# Patient Record
Sex: Male | Born: 1960 | Race: White | Hispanic: No | Marital: Married | Smoking: Former smoker
Health system: Southern US, Community
[De-identification: ages and names within clinical notes are randomized; demographics above are authoritative.]

## PROBLEM LIST (undated history)

## (undated) DIAGNOSIS — F419 Anxiety disorder, unspecified: Secondary | ICD-10-CM

## (undated) DIAGNOSIS — I10 Essential (primary) hypertension: Secondary | ICD-10-CM

## (undated) DIAGNOSIS — K639 Disease of intestine, unspecified: Secondary | ICD-10-CM

## (undated) DIAGNOSIS — D649 Anemia, unspecified: Secondary | ICD-10-CM

## (undated) DIAGNOSIS — Z5189 Encounter for other specified aftercare: Secondary | ICD-10-CM

## (undated) DIAGNOSIS — Z872 Personal history of diseases of the skin and subcutaneous tissue: Secondary | ICD-10-CM

## (undated) HISTORY — DX: Encounter for other specified aftercare: Z51.89

## (undated) HISTORY — DX: Essential (primary) hypertension: I10

## (undated) HISTORY — DX: Anemia, unspecified: D64.9

## (undated) HISTORY — DX: Personal history of diseases of the skin and subcutaneous tissue: Z87.2

## (undated) HISTORY — DX: Anxiety disorder, unspecified: F41.9

## (undated) HISTORY — DX: Disease of intestine, unspecified: K63.9

## (undated) HISTORY — PX: COLONOSCOPY: SHX174

---

## 1998-11-16 ENCOUNTER — Emergency Department (HOSPITAL_COMMUNITY): Admission: EM | Admit: 1998-11-16 | Discharge: 1998-11-16 | Payer: Self-pay | Admitting: Emergency Medicine

## 2002-03-05 ENCOUNTER — Encounter: Payer: Self-pay | Admitting: Emergency Medicine

## 2002-03-05 ENCOUNTER — Emergency Department (HOSPITAL_COMMUNITY): Admission: EM | Admit: 2002-03-05 | Discharge: 2002-03-05 | Payer: Self-pay | Admitting: Emergency Medicine

## 2006-10-05 ENCOUNTER — Ambulatory Visit (HOSPITAL_BASED_OUTPATIENT_CLINIC_OR_DEPARTMENT_OTHER): Admission: RE | Admit: 2006-10-05 | Discharge: 2006-10-05 | Payer: Self-pay | Admitting: Orthopedic Surgery

## 2010-06-23 ENCOUNTER — Emergency Department (HOSPITAL_BASED_OUTPATIENT_CLINIC_OR_DEPARTMENT_OTHER)
Admission: EM | Admit: 2010-06-23 | Discharge: 2010-06-23 | Payer: Self-pay | Source: Home / Self Care | Admitting: Emergency Medicine

## 2010-11-19 NOTE — Op Note (Signed)
NAME:  Todd Osborn, Todd Osborn                 ACCOUNT NO.:  [id number]   MEDICAL RECORD NO.:  [id number]          PATIENT TYPE:  AMB   LOCATION:  DSC                          FACILITY:  [facility]   PHYSICIAN:  Katy Fitch. Sypher, M.D. 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   POSTOPERATIVE DIAGNOSES:  1. Painful mass, dorsal aspect of left wrist, distal radioulnar joint,      with impairment of supination and pronation due to pain with MRI      evidence of a probable triangular fibrocartilage complex tear with      synovitis and myxoma of distal radioulnar joint with remodeling of      distal ulnar dorsal articular surface.  2. 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   OPERATION:  1. Diagnostic arthroscopy, right wrist, with confirmation of synovitis      and irregular reactive tissues on the dorsal aspect of the left      ulnocarpal joint.  2. Open exploration of ulnocarpal joint with open synovectomy and      removal of myxomatous lesion and degenerative triangular      fibrocartilage adjacent to the dorsal radioulnar ligament.  3. Open resection of an intracapsular ganglion of the left distal      radioulnar joint.   OPERATING SURGEON:  Katy Fitch. Sypher, M.D.   ASSISTANT:  Jonni Sanger, P.A.-C   ANESTHESIA:  General by LMA.   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  Careful clinical examination of his hands and  wrists revealed that he had pain with pronation and supination of the  left wrist and a vague mass over the dorsal aspect of the distal ulna.  He had signs of a possible ulnocarpal internal derangement.   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  He was sent for an MRI  to examine if he had a mass developing in his distal radioulnar joint  and to examine the integrity of his triangular fibrocartilage.  The MRI  returned with a probable central degenerative tear of the triangular  fibrocartilage and a myxomatous type lesion in the dorsal capsule of the  distal radioulnar joint, causing erosion of the dorsal aspect of the  ulnar head.   We advised Todd Osborn to proceed with diagnostic arthroscopy of the  wrist to confirm the triangular fibrocartilage pathology  and to address  any synovitis within the wrist and recommended open excision of his  myxomatous lesion.  After informed consent, he is brought to the  operating room at this time.   PROCEDURE:  Todd Osborn is brought to the operating room and placed in a  supine position on the operating table.   Following an anesthesia consult with Dr. Sampson Goon, general anesthesia  by LMA was recommended and accepted.   Todd Osborn was transferred to room 8, placed in the supine position on  the operating table followed by induction of general anesthesia by LMA  technique under Dr. Jarrett Ables direct supervision.   The procedure commenced with routine Betadine scrub and paint of the  left upper extremity and placement of a pneumatic tourniquet on the  proximal left brachium.  Vancomycin 1 gram was administered as an IV  prophylactic antibiotic due to a penicillin allergy.   The left arm was then exsanguinated with an Esmarch bandage and the   arterial tourniquet inflated to 240 mmHg.  The procedure commenced with  distraction of Todd Osborn's wrist in the tower designed for wrist  arthroscopy.   The wrist was instrumented with an 18 gauge needle through the 3-4  dorsal portal followed by distention of the wrist with sterile saline.  The scope was placed with atraumatic technique.  Diagnostic arthroscopy  revealed intact hyaline articular cartilage on the scaphoid, lunate and  triquetrum.   There was minor degenerative change of the scapholunate interosseous  ligament.  The scaphoid and lunate facets of the radius were normal.  Adjacent to the triangular fibrocartilage on the dorsum of the  ulnocarpal joint there was a considerable amount of reactive tissue and  synovial hypertrophy rendering it very challenging to visualize the  entire triangular fibrocartilage.   Due to a very steep and oblique nature of Todd Osborn's lunate facet, I  did not struggle to place the arthroscope into the ulnocarpal joint.  Also, given the fact that the myxoma was directly in the path of the  portal adjacent to the 5-6 or 6R portal, I elected to proceed with open  technique so as not to disrupt the myxoma.   The arthroscopic equipment was removed followed by creation of an L-  shaped incision transversely across the dorsal aspect of the ulnar  carpal articulation and extending proximally paralleling the 5th dorsal  compartment.  Subcutaneous tissue was carefully divided, taking care to  identify and gently retract the dorsal ulnar cutaneous sensory branches.  The extensor retinaculum was released over the 5th dorsal compartment  and the extensor digiti minimi retracted.  The distal radioulnar joint  capsule was bulging.  This was carefully opened with a 15 scalpel blade,  revealing a very large, gray-purple myxoma.  This was circumferentially  dissected and had about 5 mL of myxomatous material within the capsule. This did not enter the  joint.   This was meticulously debrided with a rongeur followed by use of a  microcurette and bipolar cautery.  All of the abnormal tissue was  removed as well as a pseudomembrane lining the cyst.   The distal radioulnar joint was inspected and found to have moderate  synovitis.  This was removed with a microrongeur followed by hemostasis  with bipolar cautery.   The deep surface of the triangular fibrocartilage was inspected and  palpated with the aid of a Therapist, nutritional.  There was no sign of a large  full-thickness perforation.  A transverse arthrotomy was fashioned  between the ulnar head and the triquetrum.  Great care was taken to  gently spread and avoid injury to the dorsal intercarpal ligament.   The hypertrophic synovitis and capsular tissues adjacent to the  triangular fibrocartilage were removed with open technique utilizing the  suction shaver.  The triangular fibrocartilage distal surface was  inspected and found to be intact, except for a microscopic perforation.   After completion of the open synovectomy, the wrist was irrigated with  the arthroscopic cannula followed by repair of the capsule with mattress  sutures of 3-0 Ethibond.   The capsule of the distal radioulnar joint was debrided of redundant  tissue and subsequently repaired with a series of figure of eight  sutures of 3-0 Ethibond followed by a running suture of 3-0 Ethibond for  a smooth repair at the floor of the 5th dorsal compartment.   The distal portion of the 5th dorsal compartment was reconstructed.  However, the entire compartment was not reconstructed due to the bulk of  the suture line at the floor of the compartment.   The distal radioulnar joint was noted to be completely stable in  pronation and supination.   The skin was then repaired with subdermal sutures of 4-0 Vicryl and  intradermal 3-0 Prolene.  Mr. Robideau will be immobilized in a sugar-tong  splint maintaining his forearm in  comfortable supination.   There were no apparent complications.   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      Katy Fitch Sypher, M.D.  Electronically Signed     RVS/MEDQ  D:  10/05/2006  T:  10/05/2006  Job:  [id number]

## 2016-10-10 ENCOUNTER — Encounter (HOSPITAL_COMMUNITY): Payer: Self-pay | Admitting: Emergency Medicine

## 2016-10-10 ENCOUNTER — Emergency Department (HOSPITAL_COMMUNITY)
Admission: EM | Admit: 2016-10-10 | Discharge: 2016-10-10 | Disposition: A | Discharge status: Home / Self Care | Payer: No Typology Code available for payment source | Attending: Emergency Medicine | Admitting: Emergency Medicine

## 2016-10-10 ENCOUNTER — Emergency Department (HOSPITAL_BASED_OUTPATIENT_CLINIC_OR_DEPARTMENT_OTHER)
Admit: 2016-10-10 | Discharge: 2016-10-10 | Disposition: A | Discharge status: Home / Self Care | Payer: No Typology Code available for payment source

## 2016-10-10 DIAGNOSIS — L03116 Cellulitis of left lower limb: Secondary | ICD-10-CM

## 2016-10-10 DIAGNOSIS — M7989 Other specified soft tissue disorders: Secondary | ICD-10-CM

## 2016-10-10 DIAGNOSIS — Z5321 Procedure and treatment not carried out due to patient leaving prior to being seen by health care provider: Secondary | ICD-10-CM | POA: Insufficient documentation

## 2016-10-10 DIAGNOSIS — Z79899 Other long term (current) drug therapy: Secondary | ICD-10-CM | POA: Diagnosis not present

## 2016-10-10 DIAGNOSIS — M79609 Pain in unspecified limb: Secondary | ICD-10-CM

## 2016-10-10 DIAGNOSIS — Z7722 Contact with and (suspected) exposure to environmental tobacco smoke (acute) (chronic): Secondary | ICD-10-CM | POA: Diagnosis not present

## 2016-10-10 LAB — COMPREHENSIVE METABOLIC PANEL
ALBUMIN: 3.8 g/dL (ref 3.5–5.0)
ALK PHOS: 51 U/L (ref 38–126)
ALT: 38 U/L (ref 17–63)
AST: 29 U/L (ref 15–41)
Anion gap: 7 (ref 5–15)
BILIRUBIN TOTAL: 0.7 mg/dL (ref 0.3–1.2)
BUN: 15 mg/dL (ref 6–20)
CALCIUM: 8.8 mg/dL — AB (ref 8.9–10.3)
CO2: 26 mmol/L (ref 22–32)
Chloride: 103 mmol/L (ref 101–111)
Creatinine, Ser: 0.99 mg/dL (ref 0.61–1.24)
GFR calc Af Amer: >60 mL/min (ref >60)
GFR calc non Af Amer: >60 mL/min (ref >60)
GLUCOSE: 129 mg/dL — AB (ref 65–99)
Potassium: 3.5 mmol/L (ref 3.5–5.1)
SODIUM: 136 mmol/L (ref 135–145)
TOTAL PROTEIN: 8.1 g/dL (ref 6.5–8.1)

## 2016-10-10 LAB — CBC WITH DIFFERENTIAL/PLATELET
BASOS ABS: 0 10*3/uL (ref 0.0–0.1)
BASOS PCT: 0 %
EOS PCT: 1 %
Eosinophils Absolute: 0.1 10*3/uL (ref 0.0–0.7)
HEMATOCRIT: 40.2 % (ref 39.0–52.0)
Hemoglobin: 13.6 g/dL (ref 13.0–17.0)
LYMPHS PCT: 8 %
Lymphs Abs: 1.1 10*3/uL (ref 0.7–4.0)
MCH: 29.4 pg (ref 26.0–34.0)
MCHC: 33.8 g/dL (ref 30.0–36.0)
MCV: 87 fL (ref 78.0–100.0)
MONO ABS: 1.2 10*3/uL — AB (ref 0.1–1.0)
MONOS PCT: 9 %
Neutro Abs: 11.2 10*3/uL — ABNORMAL HIGH (ref 1.7–7.7)
Neutrophils Relative %: 82 %
PLATELETS: 220 10*3/uL (ref 150–400)
RBC: 4.62 MIL/uL (ref 4.22–5.81)
RDW: 13.1 % (ref 11.5–15.5)
WBC: 13.5 10*3/uL — ABNORMAL HIGH (ref 4.0–10.5)

## 2016-10-10 LAB — URINALYSIS, ROUTINE W REFLEX MICROSCOPIC
BILIRUBIN URINE: NEGATIVE
Glucose, UA: NEGATIVE mg/dL
Ketones, ur: NEGATIVE mg/dL
LEUKOCYTES UA: NEGATIVE
Nitrite: NEGATIVE
PH: 5 (ref 5.0–8.0)
Protein, ur: 30 mg/dL — AB
SPECIFIC GRAVITY, URINE: 1.028 (ref 1.005–1.030)

## 2016-10-10 LAB — I-STAT CG4 LACTIC ACID, ED
LACTIC ACID, VENOUS: 1.06 mmol/L (ref 0.5–1.9)
Lactic Acid, Venous: 1.11 mmol/L (ref 0.5–1.9)

## 2016-10-10 MED ORDER — CLINDAMYCIN HCL 300 MG PO CAPS: 300.0000 mg | ORAL_CAPSULE | Freq: Four times a day (QID) | ORAL | 0 refills | Status: AC

## 2016-10-10 MED ORDER — OXYCODONE-ACETAMINOPHEN 5-325 MG PO TABS: 1.0000 | ORAL_TABLET | ORAL | 0 refills | Status: DC | PRN

## 2016-10-10 MED ORDER — OXYCODONE-ACETAMINOPHEN 5-325 MG PO TABS
1.0000 | ORAL_TABLET | Freq: Once | ORAL | Status: AC
  Administered 2016-10-10: 1 via ORAL
  Filled 2016-10-10: qty 1

## 2016-10-10 MED ORDER — MORPHINE SULFATE (PF) 2 MG/ML IV SOLN: 4.0000 mg | Freq: Once | INTRAVENOUS | Status: DC

## 2016-10-10 MED ORDER — CLINDAMYCIN PHOSPHATE 600 MG/50ML IV SOLN
600.0000 mg | Freq: Once | INTRAVENOUS | Status: AC
  Administered 2016-10-10: 600 mg via INTRAVENOUS
  Filled 2016-10-10: qty 50

## 2016-10-10 NOTE — ED Notes (Addendum)
Provided patient a ham sandwich and water with permission from provider.

## 2016-10-10 NOTE — ED Triage Notes (Signed)
Pt was just seen and discharged from here at about 1830 for c/o cellulitis of his left leg  Pt state she was given IV antibiotics and given prescriptions  Pt states he was told if his fever was over 100.4 at home to return  Pt states his fever was 101.2 at home so he came back  Pt also states his pharmacy was not open so he could not get his prescriptions

## 2016-10-10 NOTE — ED Triage Notes (Signed)
Pt c/o feeling sick yesterday with episode of emesis.  Pt reports having leg pain onset of 11 today, raised his pant leg and lower left leg is red and slightly swollen.

## 2016-10-10 NOTE — ED Notes (Signed)
1ST SET OF CULTURES DOWN IN MAIN LAB

## 2016-10-10 NOTE — ED Notes (Signed)
Patient was able to tolerate water with no nausea or vomiting. Requesting sandwich.

## 2016-10-10 NOTE — Discharge Instructions (Signed)
Read the information below.  Use the prescribed medication as directed.  Please discuss all new medications with your pharmacist.  Do not take additional tylenol while taking the prescribed pain medication to avoid overdose.  You may return to the Emergency Department at any time for worsening condition or any new symptoms that concern you.     If you develop increased redness, swelling, pus draining from the wound, or fevers greater than 100.4, return to the ER immediately for a recheck.

## 2016-10-10 NOTE — ED Provider Notes (Signed)
[facility] DEPT Provider Note   CSN: [id number] Arrival date & time: 10/10/16  1452     History   Chief Complaint Chief Complaint  Patient presents with  . Cellulitis    HPI Todd Osborn is a 56 y.o. male.  HPI   Pt presents with pain, redness, head over left lower extremity that he first noticed this morning.  He reports waking up yesterday with headache, chills, nausea.  Vomited several times yesterday.  This morning he noticed redness and aching pain in the front of his left shin.  Denies any injury to the leg.   Pt does not have a PCP.  Has not seen a medical provider in 4 years.    Was visiting his wife at [facility] two days ago.    History reviewed. No pertinent past medical history.  There are no active problems to display for this patient.   History reviewed. No pertinent surgical history.     Home Medications    Prior to Admission medications   Medication Sig Start Date End Date Taking? Authorizing Provider  ibuprofen (ADVIL,MOTRIN) 200 MG tablet Take 400-600 mg by mouth every 6 (six) hours as needed (pain).   Yes Historical Provider, MD  clindamycin (CLEOCIN) 300 MG capsule Take 1 capsule (300 mg total) by mouth 4 (four) times daily. 10/10/16 10/20/16  Clayton Bibles, PA-C  oxyCODONE-acetaminophen (PERCOCET/ROXICET) 5-325 MG tablet Take 1 tablet by mouth every 4 (four) hours as needed for moderate pain or severe pain. 10/10/16   Clayton Bibles, PA-C    Family History No family history on file.  Social History Social History  Substance Use Topics  . Smoking status: Passive Smoke Exposure - Never Smoker  . Smokeless tobacco: Not on file  . Alcohol use Yes     Allergies   Penicillins   Review of Systems Review of Systems  All other systems reviewed and are negative.    Physical Exam Updated Vital Signs BP 136/78 (BP Location: Right Arm)   Pulse 80   Temp 99.3 F (37.4 C) (Oral)   Resp 20   Ht 6' (1.829 m)   Wt 132 kg   SpO2 96%   BMI  39.47 kg/m   Physical Exam  Constitutional: He appears well-developed and well-nourished. No distress.  Obese   HENT:  Head: Normocephalic and atraumatic.  Neck: Neck supple.  Cardiovascular: Normal rate and regular rhythm.   Pulmonary/Chest: Effort normal and breath sounds normal. No respiratory distress. He has no wheezes. He has no rales.  Abdominal: Soft. He exhibits no distension and no mass. There is no tenderness. There is no rebound and no guarding.  Neurological: He is alert. He exhibits normal muscle tone.  Skin: He is not diaphoretic.  Left lower leg with erythema, edema, warmth, tenderness over shin (see photo).   Nursing note and vitals reviewed.      ED Treatments / Results  Labs (all labs ordered are listed, but only abnormal results are displayed) Labs Reviewed  COMPREHENSIVE METABOLIC PANEL - Abnormal; Notable for the following:       Result Value   Glucose, Bld 129 (*)    Calcium 8.8 (*)    All other components within normal limits  CBC WITH DIFFERENTIAL/PLATELET - Abnormal; Notable for the following:    WBC 13.5 (*)    Neutro Abs 11.2 (*)    Monocytes Absolute 1.2 (*)    All other components within normal limits  URINALYSIS, ROUTINE W REFLEX MICROSCOPIC -  Abnormal; Notable for the following:    APPearance HAZY (*)    Hgb urine dipstick SMALL (*)    Protein, ur 30 (*)    Bacteria, UA RARE (*)    Squamous Epithelial / LPF 0-5 (*)    All other components within normal limits  I-STAT CG4 LACTIC ACID, ED  I-STAT CG4 LACTIC ACID, ED    EKG  EKG Interpretation None       Radiology No results found.  Procedures Procedures (including critical care time)  Medications Ordered in ED Medications  clindamycin (CLEOCIN) IVPB 600 mg (0 mg Intravenous Stopped 10/10/16 1735)  oxyCODONE-acetaminophen (PERCOCET/ROXICET) 5-325 MG per tablet 1 tablet (1 tablet Oral Given 10/10/16 1639)     Initial Impression / Assessment and Plan / ED Course  I have  reviewed the triage vital signs and the nursing notes.  Pertinent labs & imaging results that were available during my care of the patient were reviewed by me and considered in my medical decision making (see chart for details).  Clinical Course as of Oct 10 2224  Mon Oct 10, 2016  1626 Edge of cellulitis marked with skin marker   [EW]    Clinical Course User Index [EW] Clayton Bibles, PA-C    Afebrile, nontoxic patient with cellulitis of left lower extremity.  Negative for clot.  Labs significant for leukocytosis, mild hyperglycemia.  Cellulitis marked.  IV clindamycin given in ED.  Pt doing well with pain control and tolerating PO easily.  D/C home with pain medication, clindamycin 300mg  QID with instructed recheck within 48 hours, sooner if worsening symptoms.  Discussed strict return precautions.  Discussed result, findings, treatment, and follow up  with patient.  Pt given return precautions.  Pt verbalizes understanding and agrees with plan.       Final Clinical Impressions(s) / ED Diagnoses   Final diagnoses:  Cellulitis of left lower extremity    New Prescriptions Discharge Medication List as of 10/10/2016  6:56 PM    START taking these medications   Details  clindamycin (CLEOCIN) 300 MG capsule Take 1 capsule (300 mg total) by mouth 4 (four) times daily., Starting Mon 10/10/2016, Until Thu 10/20/2016, Print    oxyCODONE-acetaminophen (PERCOCET/ROXICET) 5-325 MG tablet Take 1 tablet by mouth every 4 (four) hours as needed for moderate pain or severe pain., Starting Mon 10/10/2016, Print         [place], PA-C 10/10/16 2227    Duffy Bruce, MD 10/12/16 [phone]

## 2016-10-10 NOTE — ED Notes (Signed)
Provided water for PO challenge

## 2016-10-10 NOTE — Progress Notes (Addendum)
*  PRELIMINARY RESULTS* Vascular Ultrasound Left lower extremity venous duplex has been completed.  Preliminary findings: No evidence of deep vein thrombosis in the visualized veins of the left lower extremity.  Negative for baker's cyst.  Preliminary results given to [facility] @ 18:45  Everrett Coombe 10/10/2016, 6:20 PM

## 2016-10-11 ENCOUNTER — Emergency Department (HOSPITAL_COMMUNITY)
Admission: EM | Admit: 2016-10-11 | Discharge: 2016-10-11 | Disposition: A | Discharge status: Home / Self Care | Payer: No Typology Code available for payment source | Source: Home / Self Care

## 2016-10-11 NOTE — ED Notes (Signed)
Called for second time  No response from lobby 

## 2016-10-11 NOTE — ED Notes (Signed)
Called for third time  No response

## 2016-10-11 NOTE — ED Notes (Signed)
Called to take to room  No answer from lobby 

## 2016-10-13 ENCOUNTER — Emergency Department (HOSPITAL_COMMUNITY)
Admission: EM | Admit: 2016-10-13 | Discharge: 2016-10-13 | Disposition: A | Discharge status: Home / Self Care | Payer: No Typology Code available for payment source | Attending: Emergency Medicine | Admitting: Emergency Medicine

## 2016-10-13 ENCOUNTER — Encounter (HOSPITAL_COMMUNITY): Payer: Self-pay | Admitting: Emergency Medicine

## 2016-10-13 DIAGNOSIS — Z7722 Contact with and (suspected) exposure to environmental tobacco smoke (acute) (chronic): Secondary | ICD-10-CM | POA: Diagnosis not present

## 2016-10-13 DIAGNOSIS — L03116 Cellulitis of left lower limb: Secondary | ICD-10-CM | POA: Diagnosis present

## 2016-10-13 DIAGNOSIS — Z79899 Other long term (current) drug therapy: Secondary | ICD-10-CM | POA: Diagnosis not present

## 2016-10-13 NOTE — ED Provider Notes (Signed)
[facility] DEPT Provider Note   CSN: [id number] Arrival date & time: 10/13/16  0730     History   Chief Complaint Chief Complaint  Patient presents with  . Cellulitis    HPI Todd Osborn is a 56 y.o. male.  HPI : 56 year old male. He presents for reevaluation of cellulitis. Seen and evaluated on Monday, 3 days ago. Was given IV Protonix has been on clindamycin 4 times a day. He states it feels better. His head or shakes or chills or rigors. Continued redness of the leg. The area of demarcation is still in place from the erythema has receded well within it. Tolerating clindamycin. No diarrhea or abdominal discomfort.  History reviewed. No pertinent past medical history.  There are no active problems to display for this patient.   History reviewed. No pertinent surgical history.     Home Medications    Prior to Admission medications   Medication Sig Start Date End Date Taking? Authorizing Provider  clindamycin (CLEOCIN) 300 MG capsule Take 1 capsule (300 mg total) by mouth 4 (four) times daily. 10/10/16 10/20/16  Clayton Bibles, PA-C  ibuprofen (ADVIL,MOTRIN) 200 MG tablet Take 400-600 mg by mouth every 6 (six) hours as needed (pain).    Historical Provider, MD  oxyCODONE-acetaminophen (PERCOCET/ROXICET) 5-325 MG tablet Take 1 tablet by mouth every 4 (four) hours as needed for moderate pain or severe pain. 10/10/16   Clayton Bibles, PA-C    Family History No family history on file.  Social History Social History  Substance Use Topics  . Smoking status: Passive Smoke Exposure - Never Smoker  . Smokeless tobacco: Never Used  . Alcohol use Yes     Allergies   Penicillins   Review of Systems Review of Systems  Constitutional: Negative for appetite change, chills, diaphoresis, fatigue and fever.  HENT: Negative for mouth sores, sore throat and trouble swallowing.   Eyes: Negative for visual disturbance.  Respiratory: Negative for cough, chest tightness, shortness of  breath and wheezing.   Cardiovascular: Negative for chest pain.  Gastrointestinal: Negative for abdominal distention, abdominal pain, diarrhea, nausea and vomiting.  Endocrine: Negative for polydipsia, polyphagia and polyuria.  Genitourinary: Negative for dysuria, frequency and hematuria.  Musculoskeletal: Negative for gait problem.  Skin: Positive for rash. Negative for color change and pallor.  Neurological: Negative for dizziness, syncope, light-headedness and headaches.  Hematological: Does not bruise/bleed easily.  Psychiatric/Behavioral: Negative for behavioral problems and confusion.     Physical Exam Updated Vital Signs BP 136/76 (BP Location: Right Arm)   Pulse 67   Temp 98.2 F (36.8 C) (Oral)   Resp 18   SpO2 95%   Physical Exam  Constitutional: He is oriented to person, place, and time. He appears well-developed and well-nourished. No distress.  HENT:  Head: Normocephalic.  Eyes: Conjunctivae are normal. Pupils are equal, round, and reactive to light. No scleral icterus.  Neck: Normal range of motion. Neck supple. No thyromegaly present.  Cardiovascular: Normal rate and regular rhythm.  Exam reveals no gallop and no friction rub.   No murmur heard. Pulmonary/Chest: Effort normal and breath sounds normal. No respiratory distress. He has no wheezes. He has no rales.  Abdominal: Soft. Bowel sounds are normal. He exhibits no distension. There is no tenderness. There is no rebound.  Musculoskeletal: Normal range of motion.  Neurological: He is alert and oriented to person, place, and time.  Skin: Skin is warm and dry. No rash noted.  Erythema consistent with cellulitis left lower leg.  It is receded within the area of demarcation. Not elevated. Slightly indurated. Good distal perfusion and capillary refill.  Psychiatric: He has a normal mood and affect. His behavior is normal.     ED Treatments / Results  Labs (all labs ordered are listed, but only abnormal results are  displayed) Labs Reviewed - No data to display  EKG  EKG Interpretation None       Radiology No results found.  Procedures Procedures (including critical care time)  Medications Ordered in ED Medications - No data to display   Initial Impression / Assessment and Plan / ED Course  I have reviewed the triage vital signs and the nursing notes.  Pertinent labs & imaging results that were available during my care of the patient were reviewed by me and considered in my medical decision making (see chart for details).     Improving in complaint of cellulitis. Continue current clinical course clindamycin and elevation.  Final Clinical Impressions(s) / ED Diagnoses   Final diagnoses:  Cellulitis of left lower extremity    New Prescriptions New Prescriptions   No medications on file     Tanna Furry, MD 10/13/16 [phone]

## 2016-10-13 NOTE — ED Notes (Signed)
ED Provider at bedside. 

## 2016-10-13 NOTE — ED Notes (Signed)
Pt verbalized understanding of discharge instructions and denies any further questions at this time.   

## 2016-10-13 NOTE — Discharge Instructions (Signed)
Continue Clidamycin four times per day. Elevate leg above your heart as much as possible. Return to ER with any worsening.

## 2016-10-13 NOTE — ED Triage Notes (Signed)
Pt here to follow up for left lower leg cellulitis, seen in ED on 10/11/16 for same, given IV antibiotics and prescriptions. Erythema and induration have receded from marked outline of previous borders. No fevers/chills/nausea/emesis currently.

## 2016-10-20 ENCOUNTER — Emergency Department (HOSPITAL_COMMUNITY)
Admission: EM | Admit: 2016-10-20 | Discharge: 2016-10-20 | Disposition: A | Discharge status: Home / Self Care | Payer: No Typology Code available for payment source | Attending: Emergency Medicine | Admitting: Emergency Medicine

## 2016-10-20 ENCOUNTER — Encounter (HOSPITAL_COMMUNITY): Payer: Self-pay | Admitting: Emergency Medicine

## 2016-10-20 DIAGNOSIS — Z79899 Other long term (current) drug therapy: Secondary | ICD-10-CM | POA: Diagnosis not present

## 2016-10-20 DIAGNOSIS — Z7722 Contact with and (suspected) exposure to environmental tobacco smoke (acute) (chronic): Secondary | ICD-10-CM | POA: Insufficient documentation

## 2016-10-20 DIAGNOSIS — R21 Rash and other nonspecific skin eruption: Secondary | ICD-10-CM | POA: Diagnosis present

## 2016-10-20 MED ORDER — PREDNISONE 20 MG PO TABS
60.0000 mg | ORAL_TABLET | Freq: Once | ORAL | Status: AC
  Administered 2016-10-20: 60 mg via ORAL
  Filled 2016-10-20: qty 3

## 2016-10-20 MED ORDER — PREDNISONE 10 MG PO TABS: 40.0000 mg | ORAL_TABLET | Freq: Every day | ORAL | 0 refills | Status: AC

## 2016-10-20 MED ORDER — DIPHENHYDRAMINE HCL 25 MG PO CAPS
50.0000 mg | ORAL_CAPSULE | Freq: Once | ORAL | Status: AC
  Administered 2016-10-20: 50 mg via ORAL
  Filled 2016-10-20: qty 2

## 2016-10-20 MED ORDER — CEPHALEXIN 500 MG PO CAPS: 500.0000 mg | ORAL_CAPSULE | Freq: Four times a day (QID) | ORAL | 0 refills | Status: AC

## 2016-10-20 NOTE — Discharge Instructions (Signed)
Please discontinue clindamycin. Start taking prednisone tomorrow 40 mg per day for the next 4 days. May use Benadryl over-the-counter as needed as well. Wait 24 hours to begin taking your new antibiotics until the rash starts to subside.  Follow-up with your primary care provider. Return to the emergency department if you experience difficulty breathing, swelling in throat, or any other new concerning symptoms.

## 2016-10-20 NOTE — ED Provider Notes (Signed)
[facility] DEPT Provider Note   CSN: [id number] Arrival date & time: 10/20/16  1962     History   Chief Complaint Chief Complaint  Patient presents with  . Rash    HPI [facility] is a 56 y.o. male presenting with a maculopapular pruritic rash that began on his abdomen and spread to his extremities over the last 2 days. He reports being seen for left lower extremity cellulitis on April 10 and started on clindamycin. He states that he has an allergy to penicillin which manifested the same way. He is otherwise healthy, cellulitis has been improving, no fever, chills, nausea, vomiting or any cold symptoms. He is up-to-date on immunizations. Denies any history of tick bites or exposure. Denies any new detergents, lotions, or exposure to new environments. He has not tried anything for pruritus or allergic reaction over-the-counter.  HPI  History reviewed. No pertinent past medical history.  There are no active problems to display for this patient.   History reviewed. No pertinent surgical history.     Home Medications    Prior to Admission medications   Medication Sig Start Date End Date Taking? Authorizing Provider  cephALEXin (KEFLEX) 500 MG capsule Take 1 capsule (500 mg total) by mouth 4 (four) times daily. 10/20/16 10/27/16  Emeline General, PA-C  clindamycin (CLEOCIN) 300 MG capsule Take 1 capsule (300 mg total) by mouth 4 (four) times daily. 10/10/16 10/20/16  Clayton Bibles, PA-C  ibuprofen (ADVIL,MOTRIN) 200 MG tablet Take 400-600 mg by mouth every 6 (six) hours as needed (pain).    Historical Provider, MD  oxyCODONE-acetaminophen (PERCOCET/ROXICET) 5-325 MG tablet Take 1 tablet by mouth every 4 (four) hours as needed for moderate pain or severe pain. 10/10/16   Clayton Bibles, PA-C  predniSONE (DELTASONE) 10 MG tablet Take 4 tablets (40 mg total) by mouth daily. 10/20/16 10/24/16  Emeline General, PA-C    Family History History reviewed. No pertinent family  history.  Social History Social History  Substance Use Topics  . Smoking status: Passive Smoke Exposure - Never Smoker  . Smokeless tobacco: Never Used  . Alcohol use Yes     Allergies   Penicillins   Review of Systems Review of Systems  Constitutional: Negative for chills and fever.  HENT: Negative for congestion, drooling, ear pain, facial swelling, sore throat and trouble swallowing.   Eyes: Negative for pain, discharge, redness, itching and visual disturbance.  Respiratory: Negative for cough, choking, chest tightness, shortness of breath, wheezing and stridor.   Cardiovascular: Positive for leg swelling. Negative for chest pain and palpitations.       Patient has left lower extremity swelling from recent cellulitis.  Gastrointestinal: Negative for abdominal pain, diarrhea, nausea and vomiting.  Genitourinary: Negative for dysuria and hematuria.  Musculoskeletal: Negative for arthralgias, back pain, joint swelling, myalgias and neck pain.  Skin: Positive for color change and rash. Negative for pallor and wound.  Neurological: Negative for dizziness, seizures, syncope, weakness, numbness and headaches.     Physical Exam Updated Vital Signs BP (!) 148/76 (BP Location: Right Arm)   Pulse 70   Temp 98.4 F (36.9 C) (Oral)   Resp 16   SpO2 97%   Physical Exam  Constitutional: He appears well-developed and well-nourished. No distress.  Patient is afebrile, nontoxic-appearing, sitting comfortably in bed in no acute distress.  HENT:  Head: Normocephalic and atraumatic.  Mouth/Throat: Oropharynx is clear and moist. No oropharyngeal exudate.  Eyes: Conjunctivae and EOM are normal. Right eye  exhibits no discharge.  Neck: Normal range of motion. Neck supple.  Cardiovascular: Normal rate, regular rhythm and normal heart sounds.   No murmur heard. Pulmonary/Chest: Effort normal and breath sounds normal. No respiratory distress. He has no wheezes. He has no rales. He exhibits  no tenderness.  Musculoskeletal: Normal range of motion. He exhibits edema. He exhibits no tenderness or deformity.  Left lower extremity 2+ pitting edema, erythema but no warmth. Cellulitis appears to be significantly improved when compared to photo provided by patient and surgical pen marks.  Neurological: He is alert.  Skin: Skin is warm and dry. Rash noted. He is not diaphoretic. There is erythema. No pallor.  Patient with diffuse papular rash over the torso and extremities. Lower left extremity erythema  Psychiatric: He has a normal mood and affect.  Nursing note and vitals reviewed.    ED Treatments / Results  Labs (all labs ordered are listed, but only abnormal results are displayed) Labs Reviewed - No data to display  EKG  EKG Interpretation None       Radiology No results found.  Procedures Procedures (including critical care time)  Medications Ordered in ED Medications  diphenhydrAMINE (BENADRYL) capsule 50 mg (50 mg Oral Given 10/20/16 0900)  predniSONE (DELTASONE) tablet 60 mg (60 mg Oral Given 10/20/16 0900)     Initial Impression / Assessment and Plan / ED Course  I have reviewed the triage vital signs and the nursing notes.  Pertinent labs & imaging results that were available during my care of the patient were reviewed by me and considered in my medical decision making (see chart for details).     Patient presenting with what appears to be a reaction to clindamycin diffuse maculopapular pruritic rash. Patient is otherwise improving from recent cellulitis. Denies fever, chills, or any other ill symptoms. No history of diabetes or other medical problems. Immunizations up to date. Here he is afebrile nontoxic appearing.  Patient was given prednisone and Benadryl while in the ED.  Will discharge home with close PCP follow-up a short prednisone course and Benadryl over-the-counter as needed. Advised patient to discontinue clindamycin and  initate Keflex for 7  days.  Patient was discussed with Dr. Jeneen Rinks who agrees with assessment and plan.  Discussed strict return precautions and advised to return to the emergency department if experiencing any new or worsening symptoms. Instructions were understood and patient agreed with discharge plan.  Final Clinical Impressions(s) / ED Diagnoses   Final diagnoses:  Rash    New Prescriptions New Prescriptions   CEPHALEXIN (KEFLEX) 500 MG CAPSULE    Take 1 capsule (500 mg total) by mouth 4 (four) times daily.   PREDNISONE (DELTASONE) 10 MG TABLET    Take 4 tablets (40 mg total) by mouth daily.     Emeline General, PA-C 10/20/16 [place], MD 10/22/16 1730

## 2016-10-20 NOTE — ED Triage Notes (Signed)
Pt c/o diffuse moderately pruritic maculopapular rash to face, trunk, extremities onset 2 days ago. No oral edema, no SOB, lungs clear.  Started Clindamycin on 10/11/16 for cellulitis of left lower leg. LLE pitting edema and rash. No other new medications, foods, detergents, soaps.

## 2017-02-11 ENCOUNTER — Encounter (HOSPITAL_COMMUNITY): Payer: Self-pay | Admitting: Nurse Practitioner

## 2017-02-11 ENCOUNTER — Emergency Department (HOSPITAL_COMMUNITY)
Admission: EM | Admit: 2017-02-11 | Discharge: 2017-02-11 | Disposition: A | Discharge status: Home / Self Care | Payer: No Typology Code available for payment source | Attending: Emergency Medicine | Admitting: Emergency Medicine

## 2017-02-11 DIAGNOSIS — L03115 Cellulitis of right lower limb: Secondary | ICD-10-CM | POA: Diagnosis not present

## 2017-02-11 DIAGNOSIS — Z7722 Contact with and (suspected) exposure to environmental tobacco smoke (acute) (chronic): Secondary | ICD-10-CM | POA: Insufficient documentation

## 2017-02-11 DIAGNOSIS — R21 Rash and other nonspecific skin eruption: Secondary | ICD-10-CM | POA: Diagnosis present

## 2017-02-11 LAB — BASIC METABOLIC PANEL
ANION GAP: 8 (ref 5–15)
BUN: 15 mg/dL (ref 6–20)
CHLORIDE: 102 mmol/L (ref 101–111)
CO2: 27 mmol/L (ref 22–32)
CREATININE: 0.99 mg/dL (ref 0.61–1.24)
Calcium: 8.8 mg/dL — ABNORMAL LOW (ref 8.9–10.3)
GFR calc non Af Amer: >60 mL/min (ref >60)
Glucose, Bld: 98 mg/dL (ref 65–99)
POTASSIUM: 3.9 mmol/L (ref 3.5–5.1)
SODIUM: 137 mmol/L (ref 135–145)

## 2017-02-11 LAB — CBC WITH DIFFERENTIAL/PLATELET
Basophils Absolute: 0 10*3/uL (ref 0.0–0.1)
Basophils Relative: 0 %
EOS ABS: 0.3 10*3/uL (ref 0.0–0.7)
Eosinophils Relative: 2 %
HEMATOCRIT: 41.5 % (ref 39.0–52.0)
HEMOGLOBIN: 14.1 g/dL (ref 13.0–17.0)
LYMPHS ABS: 2.4 10*3/uL (ref 0.7–4.0)
Lymphocytes Relative: 17 %
MCH: 29.1 pg (ref 26.0–34.0)
MCHC: 34 g/dL (ref 30.0–36.0)
MCV: 85.7 fL (ref 78.0–100.0)
Monocytes Absolute: 1.1 10*3/uL — ABNORMAL HIGH (ref 0.1–1.0)
Monocytes Relative: 8 %
NEUTROS ABS: 10.3 10*3/uL — AB (ref 1.7–7.7)
NEUTROS PCT: 73 %
Platelets: 258 10*3/uL (ref 150–400)
RBC: 4.84 MIL/uL (ref 4.22–5.81)
RDW: 12.8 % (ref 11.5–15.5)
WBC: 14.2 10*3/uL — AB (ref 4.0–10.5)

## 2017-02-11 MED ORDER — VANCOMYCIN HCL IN DEXTROSE 1-5 GM/200ML-% IV SOLN
1000.0000 mg | Freq: Once | INTRAVENOUS | Status: AC
  Administered 2017-02-11: 1000 mg via INTRAVENOUS
  Filled 2017-02-11: qty 200

## 2017-02-11 MED ORDER — ONDANSETRON HCL 4 MG/2ML IJ SOLN
4.0000 mg | Freq: Once | INTRAMUSCULAR | Status: AC
  Administered 2017-02-11: 4 mg via INTRAVENOUS
  Filled 2017-02-11: qty 2

## 2017-02-11 MED ORDER — SULFAMETHOXAZOLE-TRIMETHOPRIM 800-160 MG PO TABS: 1.0000 | ORAL_TABLET | Freq: Two times a day (BID) | ORAL | 0 refills | Status: AC

## 2017-02-11 MED ORDER — MORPHINE SULFATE (PF) 4 MG/ML IV SOLN
4.0000 mg | Freq: Once | INTRAVENOUS | Status: AC
  Administered 2017-02-11: 4 mg via INTRAVENOUS
  Filled 2017-02-11: qty 1

## 2017-02-11 MED ORDER — HYDROCODONE-ACETAMINOPHEN 5-325 MG PO TABS: 1.0000 | ORAL_TABLET | ORAL | 0 refills | Status: DC | PRN

## 2017-02-11 NOTE — ED Notes (Signed)
Patient ambulatory to restroom with steady gait.

## 2017-02-11 NOTE — ED Triage Notes (Signed)
Pt is c/o red rush that he states is sore. Adds that last month he was evaluated for similar rush, chart review shows he was diagnosed with cellulitis. Denies fever or chills.

## 2017-02-11 NOTE — ED Provider Notes (Signed)
[facility] DEPT Provider Note   CSN: [id number] Arrival date & time: 02/11/17  1636     History   Chief Complaint Chief Complaint  Patient presents with  . Recurrent Skin Infections    Right leg    HPI Todd Osborn is a 56 y.o. male.  Pt presents to the ED today with a red rash to his right lower extremity.  The pt said that he has had cellulitis in the past.  He noticed the rash today.  No f/c.      History reviewed. No pertinent past medical history.  There are no active problems to display for this patient.   History reviewed. No pertinent surgical history.     Home Medications    Prior to Admission medications   Medication Sig Start Date End Date Taking? Authorizing Provider  HYDROcodone-acetaminophen (NORCO/VICODIN) 5-325 MG tablet Take 1 tablet by mouth every 4 (four) hours as needed. 02/11/17   Isla Pence, MD  ibuprofen (ADVIL,MOTRIN) 200 MG tablet Take 400-600 mg by mouth every 6 (six) hours as needed (pain).    [provider]  oxyCODONE-acetaminophen (PERCOCET/ROXICET) 5-325 MG tablet Take 1 tablet by mouth every 4 (four) hours as needed for moderate pain or severe pain. 10/10/16   Clayton Bibles, PA-C  sulfamethoxazole-trimethoprim (BACTRIM DS,SEPTRA DS) 800-160 MG tablet Take 1 tablet by mouth 2 (two) times daily. 02/11/17 02/18/17  Isla Pence, MD    Family History History reviewed. No pertinent family history.  Social History Social History  Substance Use Topics  . Smoking status: Passive Smoke Exposure - Never Smoker  . Smokeless tobacco: Never Used  . Alcohol use Yes     Allergies   Penicillins   Review of Systems Review of Systems  Skin: Positive for rash.  All other systems reviewed and are negative.    Physical Exam Updated Vital Signs BP (!) 171/72   Pulse 69   Temp 98.4 F (36.9 C) (Oral)   Resp 15   SpO2 97%   Physical Exam  Constitutional: He is oriented to person, place, and time. He appears  well-developed and well-nourished.  HENT:  Head: Normocephalic and atraumatic.  Right Ear: External ear normal.  Left Ear: External ear normal.  Nose: Nose normal.  Mouth/Throat: Oropharynx is clear and moist.  Eyes: Pupils are equal, round, and reactive to light. Conjunctivae and EOM are normal.  Neck: Normal range of motion. Neck supple.  Cardiovascular: Normal rate, regular rhythm, normal heart sounds and intact distal pulses.   Pulmonary/Chest: Effort normal and breath sounds normal.  Abdominal: Soft. Bowel sounds are normal.  Musculoskeletal:  Right lower extremity cellulitis.  See picture.  Neurological: He is alert and oriented to person, place, and time.  Psychiatric: He has a normal mood and affect. His behavior is normal. Judgment and thought content normal.  Nursing note and vitals reviewed.    ED Treatments / Results  Labs (all labs ordered are listed, but only abnormal results are displayed) Labs Reviewed  BASIC METABOLIC PANEL - Abnormal; Notable for the following:       Result Value   Calcium 8.8 (*)    All other components within normal limits  CBC WITH DIFFERENTIAL/PLATELET - Abnormal; Notable for the following:    WBC 14.2 (*)    Neutro Abs 10.3 (*)    Monocytes Absolute 1.1 (*)    All other components within normal limits    EKG  EKG Interpretation None       Radiology  No results found.  Procedures Procedures (including critical care time)  Medications Ordered in ED Medications  vancomycin (VANCOCIN) IVPB 1000 mg/200 mL premix (0 mg Intravenous Stopped 02/11/17 1926)  morphine 4 MG/ML injection 4 mg (4 mg Intravenous Given 02/11/17 1838)  ondansetron (ZOFRAN) injection 4 mg (4 mg Intravenous Given 02/11/17 1826)     Initial Impression / Assessment and Plan / ED Course  I have reviewed the triage vital signs and the nursing notes.  Pertinent labs & imaging results that were available during my care of the patient were reviewed by me and  considered in my medical decision making (see chart for details).  Pt given a dose of vancomycin here and is feeling better.  He knows to return if worse.  F/u with pcp.        Final Clinical Impressions(s) / ED Diagnoses   Final diagnoses:  Cellulitis of right lower extremity    New Prescriptions New Prescriptions   HYDROCODONE-ACETAMINOPHEN (NORCO/VICODIN) 5-325 MG TABLET    Take 1 tablet by mouth every 4 (four) hours as needed.   SULFAMETHOXAZOLE-TRIMETHOPRIM (BACTRIM DS,SEPTRA DS) 800-160 MG TABLET    Take 1 tablet by mouth 2 (two) times daily.     Isla Pence, MD 02/11/17 2008

## 2017-02-14 ENCOUNTER — Emergency Department (HOSPITAL_COMMUNITY)
Admission: EM | Admit: 2017-02-14 | Discharge: 2017-02-14 | Disposition: A | Discharge status: Home / Self Care | Payer: No Typology Code available for payment source | Attending: Emergency Medicine | Admitting: Emergency Medicine

## 2017-02-14 ENCOUNTER — Encounter (HOSPITAL_COMMUNITY): Payer: Self-pay | Admitting: Emergency Medicine

## 2017-02-14 DIAGNOSIS — Z87891 Personal history of nicotine dependence: Secondary | ICD-10-CM | POA: Insufficient documentation

## 2017-02-14 DIAGNOSIS — L03115 Cellulitis of right lower limb: Secondary | ICD-10-CM | POA: Insufficient documentation

## 2017-02-14 DIAGNOSIS — L539 Erythematous condition, unspecified: Secondary | ICD-10-CM | POA: Diagnosis present

## 2017-02-14 LAB — BASIC METABOLIC PANEL
Anion gap: 7 (ref 5–15)
BUN: 14 mg/dL (ref 6–20)
CO2: 26 mmol/L (ref 22–32)
CREATININE: 0.99 mg/dL (ref 0.61–1.24)
Calcium: 8.6 mg/dL — ABNORMAL LOW (ref 8.9–10.3)
Chloride: 103 mmol/L (ref 101–111)
GFR calc Af Amer: >60 mL/min (ref >60)
GFR calc non Af Amer: >60 mL/min (ref >60)
Glucose, Bld: 162 mg/dL — ABNORMAL HIGH (ref 65–99)
Potassium: 3.9 mmol/L (ref 3.5–5.1)
SODIUM: 136 mmol/L (ref 135–145)

## 2017-02-14 LAB — I-STAT CG4 LACTIC ACID, ED: LACTIC ACID, VENOUS: 1.45 mmol/L (ref 0.5–1.9)

## 2017-02-14 LAB — CBC WITH DIFFERENTIAL/PLATELET
Basophils Absolute: 0 10*3/uL (ref 0.0–0.1)
Basophils Relative: 0 %
EOS ABS: 0.4 10*3/uL (ref 0.0–0.7)
Eosinophils Relative: 4 %
HCT: 37.7 % — ABNORMAL LOW (ref 39.0–52.0)
Hemoglobin: 12.9 g/dL — ABNORMAL LOW (ref 13.0–17.0)
LYMPHS ABS: 2.4 10*3/uL (ref 0.7–4.0)
Lymphocytes Relative: 26 %
MCH: 29.5 pg (ref 26.0–34.0)
MCHC: 34.2 g/dL (ref 30.0–36.0)
MCV: 86.1 fL (ref 78.0–100.0)
MONOS PCT: 8 %
Monocytes Absolute: 0.8 10*3/uL (ref 0.1–1.0)
Neutro Abs: 5.7 10*3/uL (ref 1.7–7.7)
Neutrophils Relative %: 62 %
PLATELETS: 248 10*3/uL (ref 150–400)
RBC: 4.38 MIL/uL (ref 4.22–5.81)
RDW: 12.9 % (ref 11.5–15.5)
WBC: 9.2 10*3/uL (ref 4.0–10.5)

## 2017-02-14 MED ORDER — IBUPROFEN 200 MG PO TABS
400.0000 mg | ORAL_TABLET | Freq: Once | ORAL | Status: AC
  Administered 2017-02-14: 400 mg via ORAL
  Filled 2017-02-14: qty 2

## 2017-02-14 MED ORDER — CEPHALEXIN 500 MG PO CAPS: 500.0000 mg | ORAL_CAPSULE | Freq: Four times a day (QID) | ORAL | 0 refills | Status: DC

## 2017-02-14 MED ORDER — DEXTROSE 5 % IV SOLN
1.0000 g | Freq: Once | INTRAVENOUS | Status: AC
  Administered 2017-02-14: 1 g via INTRAVENOUS
  Filled 2017-02-14: qty 10

## 2017-02-14 NOTE — Progress Notes (Signed)
CM noted 6 ED visits in the last 6 months with no PCP and no ins.  Discussed the 3 clinic choices with pt and he chose the [facility].  Appointment made for Fri Aug 17th at 2:00 pm.  Updated pt and Dr Tomi Bamberger with appointment information.  Placed on AVS for time of D/C.  Advised pt could still go the the [facility] for pharmacy needs.  No further CM needs noted at this time.

## 2017-02-14 NOTE — ED Provider Notes (Signed)
[facility] DEPT Provider Note   CSN: [id number] Arrival date & time: 02/14/17  1158   History   Chief Complaint Chief Complaint  Patient presents with  . Cellulitis    HPI Todd Osborn is a 56 y.o. male.  HPI patient presents to the emergency room for evaluation of a persistent rash and leg pain.  Patient has a history of lower extremity cellulitis. He started having symptoms of redness and pain last week. He was seen in the emergency room on August 11 and was started on Septra.  Patient was instructed to follow-up in the [facility]. Patient called there and was unable to schedule a follow-up appointment. He feels like the redness is spreading in his lower extremity. Denies any fevers or chills. No vomiting or diarrhea. History reviewed. No pertinent past medical history.  There are no active problems to display for this patient.   History reviewed. No pertinent surgical history.     Home Medications    Prior to Admission medications   Medication Sig Start Date End Date Taking? Authorizing Provider  HYDROcodone-acetaminophen (NORCO/VICODIN) 5-325 MG tablet Take 1 tablet by mouth every 4 (four) hours as needed. 02/11/17  Yes Isla Pence, MD  ibuprofen (ADVIL,MOTRIN) 200 MG tablet Take 400-600 mg by mouth every 6 (six) hours as needed (pain).   Yes [provider]  sulfamethoxazole-trimethoprim (BACTRIM DS,SEPTRA DS) 800-160 MG tablet Take 1 tablet by mouth 2 (two) times daily. 02/11/17 02/18/17 Yes Isla Pence, MD  cephALEXin (KEFLEX) 500 MG capsule Take 1 capsule (500 mg total) by mouth 4 (four) times daily. 02/14/17   Dorie Rank, MD  oxyCODONE-acetaminophen (PERCOCET/ROXICET) 5-325 MG tablet Take 1 tablet by mouth every 4 (four) hours as needed for moderate pain or severe pain. Patient not taking: Reported on 02/14/2017 10/10/16   Clayton Bibles, PA-C    Family History No family history on file.  Social History Social History  Substance Use Topics    . Smoking status: Former Smoker    Types: Cigarettes  . Smokeless tobacco: Never Used  . Alcohol use Yes     Allergies   Penicillins   Review of Systems Review of Systems  All other systems reviewed and are negative.    Physical Exam Updated Vital Signs BP (!) 152/79 (BP Location: Right Arm)   Pulse 64   Temp 98 F (36.7 C) (Oral)   Resp 18   Ht 1.829 m (6')   Wt 129.3 kg (285 lb)   SpO2 96%   BMI 38.65 kg/m   Physical Exam  Constitutional: He appears well-developed and well-nourished. No distress.  HENT:  Head: Normocephalic and atraumatic.  Right Ear: External ear normal.  Left Ear: External ear normal.  Eyes: Conjunctivae are normal. Right eye exhibits no discharge. Left eye exhibits no discharge. No scleral icterus.  Neck: Neck supple. No tracheal deviation present.  Cardiovascular: Normal rate, regular rhythm and intact distal pulses.   Pulmonary/Chest: Effort normal and breath sounds normal. No stridor. No respiratory distress. He has no wheezes. He has no rales.  Abdominal: Soft. Bowel sounds are normal. He exhibits no distension. There is no tenderness. There is no rebound and no guarding.  Musculoskeletal: He exhibits edema and tenderness.  Erythema of the right lower extremity c/w cellulitis, no induration or fluctuance  Neurological: He is alert. He has normal strength. No cranial nerve deficit (no facial droop, extraocular movements intact, no slurred speech) or sensory deficit. He exhibits normal muscle tone. He displays no  seizure activity. Coordination normal.  Skin: Skin is warm and dry. No rash noted.  Psychiatric: He has a normal mood and affect.  Nursing note and vitals reviewed.      ED Treatments / Results  Labs (all labs ordered are listed, but only abnormal results are displayed) Labs Reviewed  CBC WITH DIFFERENTIAL/PLATELET - Abnormal; Notable for the following:       Result Value   Hemoglobin 12.9 (*)    HCT 37.7 (*)    All other  components within normal limits  BASIC METABOLIC PANEL - Abnormal; Notable for the following:    Glucose, Bld 162 (*)    Calcium 8.6 (*)    All other components within normal limits  I-STAT CG4 LACTIC ACID, ED     Radiology No results found.  Procedures Procedures (including critical care time)  Medications Ordered in ED Medications  cefTRIAXone (ROCEPHIN) 1 g in dextrose 5 % 50 mL IVPB (1 g Intravenous New Bag/Given 02/14/17 1253)  ibuprofen (ADVIL,MOTRIN) tablet 400 mg (400 mg Oral Given 02/14/17 1254)     Initial Impression / Assessment and Plan / ED Course  I have reviewed the triage vital signs and the nursing notes.  Pertinent labs & imaging results that were available during my care of the patient were reviewed by me and considered in my medical decision making (see chart for details).    Patient presents to the emergency room for persistent cellulitis. Patient is afebrile, he has no lymphangitic streaking, his laboratory tests are otherwise reassuring. His exam is consistent with a persistent cellulitis.He has been taking Septra. I reviewed his previous records.Patient had a reaction to clindamycin in the past. He was given Keflex and tolerated that well. Apparently that was the last antibiotics he received for treatment of his previous cellulitis.  Patient was given a dose of Rocephin in the emergency room. I will discharge him home with a prescription for Keflex. He was provided referral to the [facility]  Final Clinical Impressions(s) / ED Diagnoses   Final diagnoses:  Cellulitis of right lower extremity    New Prescriptions New Prescriptions   CEPHALEXIN (KEFLEX) 500 MG CAPSULE    Take 1 capsule (500 mg total) by mouth 4 (four) times daily.     Dorie Rank, MD 02/14/17 1351

## 2017-02-14 NOTE — ED Notes (Signed)
Pt right lower leg appears to have redness and swelling in the shin/calf area, no oped wounds or drainage is noted. Pt states that area is 7/10 dull ache pain, that worsens with touch.

## 2017-02-14 NOTE — ED Triage Notes (Signed)
Patient c/o cellulitis on RLE that has gotten worse since was seen here on Saturday. Patient states that he was told the [facility] told him they dont due follow up appts and they didn't have an open appt for while.  Patient doesn't have PCP.  Patient states that he is still taking antibiotics.

## 2017-02-14 NOTE — Discharge Instructions (Signed)
Take the antibiotics as prescribed, follow-up with the [facility], return as needed for worsening symptoms

## 2017-02-17 ENCOUNTER — Encounter: Payer: Self-pay | Admitting: Family Medicine

## 2017-02-17 ENCOUNTER — Ambulatory Visit (INDEPENDENT_AMBULATORY_CARE_PROVIDER_SITE_OTHER): Payer: No Typology Code available for payment source | Admitting: Family Medicine

## 2017-02-17 VITALS — BP 160/90 | HR 67 | Temp 98.7°F | Resp 14 | Ht 72.0 in | Wt 288.0 lb

## 2017-02-17 DIAGNOSIS — L03115 Cellulitis of right lower limb: Secondary | ICD-10-CM

## 2017-02-17 DIAGNOSIS — R739 Hyperglycemia, unspecified: Secondary | ICD-10-CM

## 2017-02-17 LAB — POCT URINALYSIS DIP (DEVICE)
Bilirubin Urine: NEGATIVE
GLUCOSE, UA: NEGATIVE mg/dL
Hgb urine dipstick: NEGATIVE
KETONES UR: NEGATIVE mg/dL
Leukocytes, UA: NEGATIVE
Nitrite: NEGATIVE
PH: 6.5 (ref 5.0–8.0)
PROTEIN: NEGATIVE mg/dL
SPECIFIC GRAVITY, URINE: 1.02 (ref 1.005–1.030)
UROBILINOGEN UA: 0.2 mg/dL (ref 0.0–1.0)

## 2017-02-17 NOTE — Progress Notes (Deleted)
   Patient ID: [facility], male    DOB: 07-Jun-1961, 56 y.o.   MRN: [id number]  PCP: Scot Jun, FNP  Chief Complaint  Patient presents with  . Establish Care  . Hospitalization Follow-up    Subjective:  HPI Todd [facility]born is a 56 y.o. male present to establish care and hospital follow-up. [facility]'s only medical problems include reoccurring cellulitis of the lower legs and obesity. He reports no other significant medical history. He reports his father had colon cancer. No family history of  No prior history of IV drug use or tattoos    Social History   Social History  . Marital status: Married    Spouse name: N/A  . Number of children: N/A  . Years of education: N/A   Occupational History  . Not on file.   Social History Main Topics  . Smoking status: Former Smoker    Types: Cigarettes  . Smokeless tobacco: Never Used  . Alcohol use Yes  . Drug use: No  . Sexual activity: Not on file   Other Topics Concern  . Not on file   Social History Narrative  . No narrative on file    Family History  Problem Relation Age of Onset  . Cancer Mother   . Cancer Father    Review of Systems See HPI   Allergies  Allergen Reactions  . Penicillins Hives    Prior to Admission medications   Medication Sig Start Date End Date Taking? Authorizing Provider  cephALEXin (KEFLEX) 500 MG capsule Take 1 capsule (500 mg total) by mouth 4 (four) times daily. 02/14/17  Yes Dorie Rank, MD  ibuprofen (ADVIL,MOTRIN) 200 MG tablet Take 400-600 mg by mouth every 6 (six) hours as needed (pain).   Yes [provider]  HYDROcodone-acetaminophen (NORCO/VICODIN) 5-325 MG tablet Take 1 tablet by mouth every 4 (four) hours as needed. Patient not taking: Reported on 02/17/2017 02/11/17   Isla Pence, MD  oxyCODONE-acetaminophen (PERCOCET/ROXICET) 5-325 MG tablet Take 1 tablet by mouth every 4 (four) hours as needed for moderate pain or severe pain. Patient not taking: Reported on  02/14/2017 10/10/16   Clayton Bibles, PA-C  sulfamethoxazole-trimethoprim (BACTRIM DS,SEPTRA DS) 800-160 MG tablet Take 1 tablet by mouth 2 (two) times daily. Patient not taking: Reported on 02/17/2017 02/11/17 02/18/17  Isla Pence, MD    Past Medical, Surgical Family and Social History reviewed and updated.    Objective:   Today's Vitals   02/17/17 1401  BP: (!) 160/90  Pulse: 67  Resp: 14  Temp: 98.7 F (37.1 C)  TempSrc: Oral  SpO2: 96%  Weight: 288 lb (130.6 kg)  Height: 6' (1.829 m)    Wt Readings from Last 3 Encounters:  02/17/17 288 lb (130.6 kg)  02/14/17 285 lb (129.3 kg)  10/10/16 291 lb (132 kg)    Physical Exam         Assessment & Plan:  There are no diagnoses linked to this encounter.  -complete cologuard -complete EKG  [facility]. [name], MSN, FNP-C [facility]  [name] [place], [place] [address] [phone]

## 2017-02-20 ENCOUNTER — Encounter: Payer: Self-pay | Admitting: Family Medicine

## 2017-02-20 ENCOUNTER — Ambulatory Visit (INDEPENDENT_AMBULATORY_CARE_PROVIDER_SITE_OTHER): Payer: No Typology Code available for payment source | Admitting: Family Medicine

## 2017-02-20 ENCOUNTER — Other Ambulatory Visit (INDEPENDENT_AMBULATORY_CARE_PROVIDER_SITE_OTHER): Payer: No Typology Code available for payment source | Admitting: Family Medicine

## 2017-02-20 VITALS — BP 172/88 | HR 80 | Temp 98.8°F | Resp 14 | Ht 72.0 in | Wt 288.0 lb

## 2017-02-20 DIAGNOSIS — R030 Elevated blood-pressure reading, without diagnosis of hypertension: Secondary | ICD-10-CM

## 2017-02-20 DIAGNOSIS — R9431 Abnormal electrocardiogram [ECG] [EKG]: Secondary | ICD-10-CM | POA: Diagnosis not present

## 2017-02-20 DIAGNOSIS — Z1329 Encounter for screening for other suspected endocrine disorder: Secondary | ICD-10-CM

## 2017-02-20 DIAGNOSIS — R7303 Prediabetes: Secondary | ICD-10-CM | POA: Diagnosis not present

## 2017-02-20 DIAGNOSIS — Z131 Encounter for screening for diabetes mellitus: Secondary | ICD-10-CM

## 2017-02-20 DIAGNOSIS — Z130 Encounter for screening for diseases of the blood and blood-forming organs and certain disorders involving the immune mechanism: Secondary | ICD-10-CM

## 2017-02-20 DIAGNOSIS — R079 Chest pain, unspecified: Secondary | ICD-10-CM | POA: Diagnosis not present

## 2017-02-20 DIAGNOSIS — Z125 Encounter for screening for malignant neoplasm of prostate: Secondary | ICD-10-CM

## 2017-02-20 DIAGNOSIS — Z1211 Encounter for screening for malignant neoplasm of colon: Secondary | ICD-10-CM

## 2017-02-20 DIAGNOSIS — I10 Essential (primary) hypertension: Secondary | ICD-10-CM | POA: Diagnosis not present

## 2017-02-20 LAB — CBC WITH DIFFERENTIAL/PLATELET
BASOS PCT: 0 %
Basophils Absolute: 0 cells/uL (ref 0–200)
EOS PCT: 2 %
Eosinophils Absolute: 256 cells/uL (ref 15–500)
HEMATOCRIT: 39.9 % (ref 38.5–50.0)
Hemoglobin: 13.2 g/dL (ref 13.2–17.1)
LYMPHS PCT: 13 %
Lymphs Abs: 1664 cells/uL (ref 850–3900)
MCH: 28.8 pg (ref 27.0–33.0)
MCHC: 33.1 g/dL (ref 32.0–36.0)
MCV: 87.1 fL (ref 80.0–100.0)
MONO ABS: 1024 {cells}/uL — AB (ref 200–950)
MONOS PCT: 8 %
MPV: 9.7 fL (ref 7.5–12.5)
NEUTROS PCT: 77 %
Neutro Abs: 9856 cells/uL — ABNORMAL HIGH (ref 1500–7800)
PLATELETS: 310 10*3/uL (ref 140–400)
RBC: 4.58 MIL/uL (ref 4.20–5.80)
RDW: 13.3 % (ref 11.0–15.0)
WBC: 12.8 10*3/uL — AB (ref 3.8–10.8)

## 2017-02-20 LAB — POCT GLYCOSYLATED HEMOGLOBIN (HGB A1C): HEMOGLOBIN A1C: 5.7

## 2017-02-20 MED ORDER — HYDROCHLOROTHIAZIDE 12.5 MG PO TABS: 12.5000 mg | ORAL_TABLET | Freq: Every day | ORAL | 3 refills | Status: DC

## 2017-02-20 MED ORDER — AMLODIPINE BESYLATE 5 MG PO TABS: 5.0000 mg | ORAL_TABLET | Freq: Every day | ORAL | 3 refills | Status: DC

## 2017-02-20 NOTE — Progress Notes (Signed)
Patient was seen in office on 02/17/2017, however had to leave prior to completing labs and obtaining EKG.  He presents today to complete screening from prior visit.

## 2017-02-20 NOTE — Progress Notes (Signed)
Patient ID: Todd Osborn, male    DOB: 11/27/1960, 56 y.o.   MRN: [id number]  PCP: Scot Jun, FNP  Chief Complaint  Patient presents with  . Establish Care  . Hypertension   Subjective:  HPI Todd Osborn is a 56 y.o. male present to establish care and hospital follow-up. Todd Osborn's only medical problem include reoccurring cellulitis of the lower legs and obesity. He reports in the past being told that his blood pressure was elevated although reports no prior medication treatment for hypertension. He reports no other significant medical history. Reports no family history of heart disease or diabetes. Although, he reports that his father had colon cancer. Todd Osborn denies heavy alcohol use, prior IV drug use or tattoos.  Todd Osborn to a intermittent chest tightness mainly occurring in mid-sternal region of his chest. This has  been on-going for over 1 year. He reports tightness improves with rest and sometimes belching. Todd Osborn has not had any prior cardiovascular work-up previously. Risk factors include sedentary lifestyle and obesity, current Body mass index is 39.06 kg/m.   Social History   Social History  . Marital status: Married    Spouse name: N/A  . Number of children: N/A  . Years of education: N/A   Occupational History  . Not on file.   Social History Main Topics  . Smoking status: Former Smoker    Types: Cigarettes  . Smokeless tobacco: Never Used  . Alcohol use Yes  . Drug use: No  . Sexual activity: Not on file   Other Topics Concern  . Not on file   Social History Narrative  . No narrative on file    Family History  Problem Relation Age of Onset  . Cancer Mother   . Cancer Father    Review of Systems  See history of present illness  Prior to Admission medications   Medication Sig Start Date End Date Taking? Authorizing Provider  cephALEXin (KEFLEX) 500 MG capsule Take 1 capsule (500 mg total) by mouth 4 (four) times daily. 02/14/17   Dorie Rank, MD   HYDROcodone-acetaminophen (NORCO/VICODIN) 5-325 MG tablet Take 1 tablet by mouth every 4 (four) hours as needed. Patient not taking: Reported on 02/17/2017 02/11/17   Isla Pence, MD  ibuprofen (ADVIL,MOTRIN) 200 MG tablet Take 400-600 mg by mouth every 6 (six) hours as needed (pain).    [provider]  oxyCODONE-acetaminophen (PERCOCET/ROXICET) 5-325 MG tablet Take 1 tablet by mouth every 4 (four) hours as needed for moderate pain or severe pain. Patient not taking: Reported on 02/14/2017 10/10/16   Clayton Bibles, PA-C    Past Medical, Surgical Family and Social History reviewed and updated.    Objective:  There were no vitals filed for this visit.  Wt Readings from Last 3 Encounters:  02/17/17 288 lb (130.6 kg)  02/14/17 285 lb (129.3 kg)  10/10/16 291 lb (132 kg)    Physical Exam  Constitutional: He is oriented to person, place, and time. He appears well-developed and well-nourished.  HENT:  Head: Normocephalic.  Eyes: Pupils are equal, round, and reactive to light. Conjunctivae and EOM are normal.  Neck: Normal range of motion. Neck supple. No JVD present. No thyromegaly present.  Cardiovascular: Normal rate, regular rhythm, normal heart sounds and intact distal pulses.   Abnormal EKG significant for left fascicular block   Pulmonary/Chest: Effort normal and breath sounds normal.  Abdominal: Bowel sounds are normal.  Lymphadenopathy:    He has no cervical adenopathy.  Neurological: He is alert and oriented to person, place, and time.  Skin: Skin is warm and dry.  Psychiatric: He has a normal mood and affect. His behavior is normal. Judgment and thought content normal.   Assessment & Plan:  1. Essential Hypertension, uncontrolled  -Start Amlodipine 5 mg once daily  -Start Hydrochlorothiazide 12.5 mg once daily  - COMPLETE METABOLIC PANEL WITH GFR  2. Screening for deficiency anemia - CBC with Differential/Platelet  3. Screening for thyroid disorder - Thyroid  Panel With TSH  4. Screening for prostate cancer - PSA, total and free  5. Chest pain, unspecified type, chronic intermittent pain. Patient has positive risk factors for cardiovascular disease such as obesity, sedentary lifestyle, former smoker, prediabetes, and untreated hypertension.  -Referring to cardiology for second opinion  -Start aspirin 81 mg once daily.   6. Abnormal EKG, significant for left anterior fascicular block, NSR. -Referral to cardiology for second opinion as patient reports chronic chest pain.  7. Prediabetes, A1C 5.7 -Increase physical activity 150 minutes per week  RTC: 6 weeks for hypertension evaluation   Carroll Sage. [name], MSN, FNP-C [facility]  [name] [place],  [address] [phone]

## 2017-02-20 NOTE — Patient Instructions (Addendum)
For blood pressure management: I am starting you on Amlodipine 5 mg once daily. I am also starting you on hydrochlorothiazide 12.5 mg once daily. I recommend that you start Aspirin 81 mg once daily for cardiovascular health    For colon cancer screening-Cologaurd, you will receive information through the mail to submit sample.    Prediabetes Prediabetes is the condition of having a blood sugar (blood glucose) level that is higher than it should be, but not high enough for you to be diagnosed with type 2 diabetes. Having prediabetes puts you at risk for developing type 2 diabetes (type 2 diabetes mellitus). Prediabetes may be called impaired glucose tolerance or impaired fasting glucose. Prediabetes usually does not cause symptoms. Your health care provider can diagnose this condition with blood tests. You may be tested for prediabetes if you are overweight and if you have at least one other risk factor for prediabetes. Risk factors for prediabetes include:  Having a family member with type 2 diabetes.  Being overweight or obese.  Being older than age 57.  Being of American-Indian, African-American, Hispanic/Latino, or Asian/Pacific Islander descent.  Having an inactive (sedentary) lifestyle.  Having a history of gestational diabetes or polycystic ovarian syndrome (PCOS).  Having low levels of good cholesterol (HDL-C) or high levels of blood fats (triglycerides).  Having high blood pressure.  What is blood glucose and how is blood glucose measured?  Blood glucose refers to the amount of glucose in your bloodstream. Glucose comes from eating foods that contain sugars and starches (carbohydrates) that the body breaks down into glucose. Your blood glucose level may be measured in mg/dL (milligrams per deciliter) or mmol/L (millimoles per liter).Your blood glucose may be checked with one or more of the following blood tests:  A fasting blood glucose (FBG) test. You will not be allowed  to eat (you will fast) for at least 8 hours before a blood sample is taken. ? A normal range for FBG is 70-100 mg/dl (3.9-5.6 mmol/L).  An A1c (hemoglobin A1c) blood test. This test provides information about blood glucose control over the previous 2?21months.  An oral glucose tolerance test (OGTT). This test measures your blood glucose twice: ? After fasting. This is your baseline level. ? Two hours after you drink a beverage that contains glucose.  You may be diagnosed with prediabetes:  If your FBG is 100?125 mg/dL (5.6-6.9 mmol/L).  If your A1c level is 5.7?6.4%.  If your OGGT result is 140?199 mg/dL (7.8-11 mmol/L).  These blood tests may be repeated to confirm your diagnosis. What happens if blood glucose is too high? The pancreas produces a hormone (insulin) that helps move glucose from the bloodstream into cells. When cells in the body do not respond properly to insulin that the body makes (insulin resistance), excess glucose builds up in the blood instead of going into cells. As a result, high blood glucose (hyperglycemia) can develop, which can cause many complications. This is a symptom of prediabetes. What can happen if blood glucose stays higher than normal for a long time? Having high blood glucose for a long time is dangerous. Too much glucose in your blood can damage your nerves and blood vessels. Long-term damage can lead to complications from diabetes, which may include:  Heart disease.  Stroke.  Blindness.  Kidney disease.  Depression.  Poor circulation in the feet and legs, which could lead to surgical removal (amputation) in severe cases.  How can prediabetes be prevented from turning into type 2 diabetes?  To help prevent type 2 diabetes, take the following actions:  Be physically active. ? Do moderate-intensity physical activity for at least 30 minutes on at least 5 days of the week, or as much as told by your health care provider. This could be brisk  walking, biking, or water aerobics. ? Ask your health care provider what activities are safe for you. A mix of physical activities may be best, such as walking, swimming, cycling, and strength training.  Lose weight as told by your health care provider. ? Losing 5-7% of your body weight can reverse insulin resistance. ? Your health care provider can determine how much weight loss is best for you and can help you lose weight safely.  Follow a healthy meal plan. This includes eating lean proteins, complex carbohydrates, fresh fruits and vegetables, low-fat dairy products, and healthy fats. ? Follow instructions from your health care provider about eating or drinking restrictions. ? Make an appointment to see a diet and nutrition specialist (registered dietitian) to help you create a healthy eating plan that is right for you.  Do not smoke or use any tobacco products, such as cigarettes, chewing tobacco, and e-cigarettes. If you need help quitting, ask your health care provider.  Take over-the-counter and prescription medicines as told by your health care provider. You may be prescribed medicines that help lower the risk of type 2 diabetes.  This information is not intended to replace advice given to you by your health care provider. Make sure you discuss any questions you have with your health care provider. Document Released: 10/12/2015 Document Revised: 11/26/2015 Document Reviewed: 08/11/2015 Elsevier Interactive Patient Education  2018 [place] Eating Plan DASH stands for "Dietary Approaches to Stop Hypertension." The DASH eating plan is a healthy eating plan that has been shown to reduce high blood pressure (hypertension). It may also reduce your risk for type 2 diabetes, heart disease, and stroke. The DASH eating plan may also help with weight loss. What are tips for following this plan? General guidelines  Avoid eating more than 2,300 mg (milligrams) of salt (sodium) a  day. If you have hypertension, you may need to reduce your sodium intake to 1,500 mg a day.  Limit alcohol intake to no more than 1 drink a day for nonpregnant women and 2 drinks a day for men. One drink equals 12 oz of beer, 5 oz of wine, or 1 oz of hard liquor.  Work with your health care provider to maintain a healthy body weight or to lose weight. Ask what an ideal weight is for you.  Get at least 30 minutes of exercise that causes your heart to beat faster (aerobic exercise) most days of the week. Activities may include walking, swimming, or biking.  Work with your health care provider or diet and nutrition specialist (dietitian) to adjust your eating plan to your individual calorie needs. Reading food labels  Check food labels for the amount of sodium per serving. Choose foods with less than 5 percent of the Daily Value of sodium. Generally, foods with less than 300 mg of sodium per serving fit into this eating plan.  To find whole grains, look for the word "whole" as the first word in the ingredient list. Shopping  Buy products labeled as "low-sodium" or "no salt added."  Buy fresh foods. Avoid canned foods and premade or frozen meals. Cooking  Avoid adding salt when cooking. Use salt-free seasonings or herbs instead of table salt or sea  salt. Check with your health care provider or pharmacist before using salt substitutes.  Do not fry foods. Cook foods using healthy methods such as baking, boiling, grilling, and broiling instead.  Cook with heart-healthy oils, such as olive, canola, soybean, or sunflower oil. Meal planning   Eat a balanced diet that includes: ? 5 or more servings of fruits and vegetables each day. At each meal, try to fill half of your plate with fruits and vegetables. ? Up to 6-8 servings of whole grains each day. ? Less than 6 oz of lean meat, poultry, or fish each day. A 3-oz serving of meat is about the same size as a deck of cards. One egg equals 1  oz. ? 2 servings of low-fat dairy each day. ? A serving of nuts, seeds, or beans 5 times each week. ? Heart-healthy fats. Healthy fats called Omega-3 fatty acids are found in foods such as flaxseeds and coldwater fish, like sardines, salmon, and mackerel.  Limit how much you eat of the following: ? Canned or prepackaged foods. ? Food that is high in trans fat, such as fried foods. ? Food that is high in saturated fat, such as fatty meat. ? Sweets, desserts, sugary drinks, and other foods with added sugar. ? Full-fat dairy products.  Do not salt foods before eating.  Try to eat at least 2 vegetarian meals each week.  Eat more home-cooked food and less restaurant, buffet, and fast food.  When eating at a restaurant, ask that your food be prepared with less salt or no salt, if possible. What foods are recommended? The items listed may not be a complete list. Talk with your dietitian about what dietary choices are best for you. Grains Whole-grain or whole-wheat bread. Whole-grain or whole-wheat pasta. Brown rice. [place]. Bulgur. Whole-grain and low-sodium cereals. Pita bread. Low-fat, low-sodium crackers. Whole-wheat flour tortillas. Vegetables Fresh or frozen vegetables (raw, steamed, roasted, or grilled). Low-sodium or reduced-sodium tomato and vegetable juice. Low-sodium or reduced-sodium tomato sauce and tomato paste. Low-sodium or reduced-sodium canned vegetables. Fruits All fresh, dried, or frozen fruit. Canned fruit in natural juice (without added sugar). Meat and other protein foods Skinless chicken or [place]. Ground chicken or [place]. Pork with fat trimmed off. Fish and seafood. Egg whites. Dried beans, peas, or lentils. Unsalted nuts, nut butters, and seeds. Unsalted canned beans. Lean cuts of beef with fat trimmed off. Low-sodium, lean deli meat. Dairy Low-fat (1%) or fat-free (skim) milk. Fat-free, low-fat, or reduced-fat cheeses. Nonfat, low-sodium ricotta or cottage  cheese. Low-fat or nonfat yogurt. Low-fat, low-sodium cheese. Fats and oils Soft margarine without trans fats. Vegetable oil. Low-fat, reduced-fat, or light mayonnaise and salad dressings (reduced-sodium). Canola, safflower, olive, soybean, and sunflower oils. Avocado. Seasoning and other foods Herbs. Spices. Seasoning mixes without salt. Unsalted popcorn and pretzels. Fat-free sweets. What foods are not recommended? The items listed may not be a complete list. Talk with your dietitian about what dietary choices are best for you. Grains Baked goods made with fat, such as croissants, muffins, or some breads. Dry pasta or rice meal packs. Vegetables Creamed or fried vegetables. Vegetables in a cheese sauce. Regular canned vegetables (not low-sodium or reduced-sodium). Regular canned tomato sauce and paste (not low-sodium or reduced-sodium). Regular tomato and vegetable juice (not low-sodium or reduced-sodium). Angie Fava. Olives. Fruits Canned fruit in a light or heavy syrup. Fried fruit. Fruit in cream or butter sauce. Meat and other protein foods Fatty cuts of meat. Ribs. Fried meat. Berniece Salines. Sausage. Bologna and  other processed lunch meats. Salami. Fatback. Hotdogs. Bratwurst. Salted nuts and seeds. Canned beans with added salt. Canned or smoked fish. Whole eggs or egg yolks. Chicken or [place] with skin. Dairy Whole or 2% milk, cream, and half-and-half. Whole or full-fat cream cheese. Whole-fat or sweetened yogurt. Full-fat cheese. Nondairy creamers. Whipped toppings. Processed cheese and cheese spreads. Fats and oils Butter. Stick margarine. Lard. Shortening. Ghee. Bacon fat. Tropical oils, such as coconut, palm kernel, or palm oil. Seasoning and other foods Salted popcorn and pretzels. Onion salt, garlic salt, seasoned salt, table salt, and sea salt. Worcestershire sauce. Tartar sauce. Barbecue sauce. Teriyaki sauce. Soy sauce, including reduced-sodium. Steak sauce. Canned and packaged gravies.  Fish sauce. Oyster sauce. Cocktail sauce. Horseradish that you find on the shelf. Ketchup. Mustard. Meat flavorings and tenderizers. Bouillon cubes. Hot sauce and Tabasco sauce. Premade or packaged marinades. Premade or packaged taco seasonings. Relishes. Regular salad dressings. Where to find more information:  National Heart, Lung, and [place]: https://wilson-eaton.com/  American Heart Association: www.heart.org Summary  The DASH eating plan is a healthy eating plan that has been shown to reduce high blood pressure (hypertension). It may also reduce your risk for type 2 diabetes, heart disease, and stroke.  With the DASH eating plan, you should limit salt (sodium) intake to 2,300 mg a day. If you have hypertension, you may need to reduce your sodium intake to 1,500 mg a day.  When on the DASH eating plan, aim to eat more fresh fruits and vegetables, whole grains, lean proteins, low-fat dairy, and heart-healthy fats.  Work with your health care provider or diet and nutrition specialist (dietitian) to adjust your eating plan to your individual calorie needs. This information is not intended to replace advice given to you by your health care provider. Make sure you discuss any questions you have with your health care provider. Document Released: 06/09/2011 Document Revised: 06/13/2016 Document Reviewed: 06/13/2016 Elsevier Interactive Patient Education  2017 [employer].

## 2017-02-21 LAB — THYROID PANEL WITH TSH
FREE THYROXINE INDEX: 3 (ref 1.4–3.8)
T3 UPTAKE: 30 % (ref 22–35)
T4 TOTAL: 9.9 ug/dL (ref 4.9–10.5)
TSH: 1.07 mIU/L (ref 0.40–4.50)

## 2017-02-21 LAB — COMPLETE METABOLIC PANEL WITH GFR
ALBUMIN: 3.7 g/dL (ref 3.6–5.1)
ALK PHOS: 60 U/L (ref 40–115)
ALT: 16 U/L (ref 9–46)
AST: 16 U/L (ref 10–35)
BUN: 11 mg/dL (ref 7–25)
CALCIUM: 8.5 mg/dL — AB (ref 8.6–10.3)
CHLORIDE: 96 mmol/L — AB (ref 98–110)
CO2: 17 mmol/L — ABNORMAL LOW (ref 20–32)
CREATININE: 0.86 mg/dL (ref 0.70–1.33)
GFR, Est Non African American: >89 mL/min (ref >=60)
Glucose, Bld: 65 mg/dL (ref 65–99)
Potassium: 4 mmol/L (ref 3.5–5.3)
Sodium: 142 mmol/L (ref 135–146)
Total Bilirubin: 0.3 mg/dL (ref 0.2–1.2)
Total Protein: 7.1 g/dL (ref 6.1–8.1)

## 2017-02-23 LAB — PSA, TOTAL AND FREE

## 2017-02-23 NOTE — Progress Notes (Signed)
Patient had to leave in prior to completion of visit t. He will return 02/20/17 to complete visit. No charge for visit today

## 2017-03-01 LAB — COLOGUARD

## 2017-03-03 ENCOUNTER — Other Ambulatory Visit: Payer: Self-pay | Admitting: Family Medicine

## 2017-03-03 ENCOUNTER — Telehealth: Payer: Self-pay

## 2017-03-03 ENCOUNTER — Other Ambulatory Visit (INDEPENDENT_AMBULATORY_CARE_PROVIDER_SITE_OTHER): Payer: No Typology Code available for payment source

## 2017-03-03 DIAGNOSIS — Z125 Encounter for screening for malignant neoplasm of prostate: Secondary | ICD-10-CM

## 2017-03-03 MED ORDER — LOSARTAN POTASSIUM 50 MG PO TABS: 50.0000 mg | ORAL_TABLET | Freq: Every day | ORAL | 3 refills | Status: DC

## 2017-03-03 NOTE — Progress Notes (Signed)
Please contact Mr. Pelzer to advise him to discontinue his Amlodipine and start Losartan 50 mg once daily and continue Hydrochlorothiazide. He should follow-up here in office in 3 weeks for blood pressure follow-up. Notify me sooner if he continues to experience nose bleeds.   Carroll Sage. [name], MSN, FNP-C [facility]  [name] [place], [place] [address] [phone]

## 2017-03-03 NOTE — Progress Notes (Signed)
Called, no answer. Left a message for patient to return call.

## 2017-03-03 NOTE — Progress Notes (Signed)
Enter PSA test. Sample was unable to obtained from last lab draw.

## 2017-03-03 NOTE — Telephone Encounter (Signed)
Patient came in this morning and stated that the blood pressure medication was making his nose bleed and making him not feel well. Patient blood pressure was 142/70 today in office.

## 2017-03-04 ENCOUNTER — Other Ambulatory Visit: Payer: Self-pay | Admitting: Family Medicine

## 2017-03-04 ENCOUNTER — Encounter: Payer: Self-pay | Admitting: Family Medicine

## 2017-03-04 LAB — PSA: PSA: 1.5 ng/mL (ref <=4.0)

## 2017-03-04 NOTE — Progress Notes (Signed)
Please mail to patient

## 2017-03-16 ENCOUNTER — Telehealth: Payer: Self-pay | Admitting: Family Medicine

## 2017-03-16 DIAGNOSIS — K639 Disease of intestine, unspecified: Secondary | ICD-10-CM

## 2017-03-16 HISTORY — DX: Disease of intestine, unspecified: K63.9

## 2017-03-16 NOTE — Telephone Encounter (Signed)
Please contact patient to advise that his recent colon cancer screening with Cologuard resulted as abnormal which could mean the presence or precancerous or active cancer cells. He is being referred to gastroenterology for colonoscopy and further evaluation of his most recent abnormal colon cancer screening.  Todd Osborn. [name], MSN, FNP-C [facility]  [name] [place], [place] [address] [phone]

## 2017-03-16 NOTE — Telephone Encounter (Signed)
Left a vm for patient to callback 

## 2017-03-16 NOTE — Telephone Encounter (Signed)
Patient notified and will wait to hear from [facility]

## 2017-03-17 ENCOUNTER — Encounter: Payer: Self-pay | Admitting: Gastroenterology

## 2017-03-20 ENCOUNTER — Ambulatory Visit (INDEPENDENT_AMBULATORY_CARE_PROVIDER_SITE_OTHER): Payer: No Typology Code available for payment source | Admitting: Family Medicine

## 2017-03-20 ENCOUNTER — Encounter: Payer: Self-pay | Admitting: Family Medicine

## 2017-03-20 VITALS — BP 134/60 | HR 67 | Temp 97.6°F | Resp 14 | Ht 72.0 in | Wt 289.0 lb

## 2017-03-20 DIAGNOSIS — I10 Essential (primary) hypertension: Secondary | ICD-10-CM | POA: Diagnosis not present

## 2017-03-20 NOTE — Patient Instructions (Signed)
Monitor blood pressure readings at least once every other week and keep a log of readings. If pressure is consistently greater than 140/90, return for blood pressure management sooner than 3 months follow-up. You have Losartan and may resume medication if blood pressure is abnormally elevated greater than 140/90 persistently.     Hypertension Hypertension is another name for high blood pressure. High blood pressure forces your heart to work harder to pump blood. This can cause problems over time. There are two numbers in a blood pressure reading. There is a top number (systolic) over a bottom number (diastolic). It is best to have a blood pressure below 120/80. Healthy choices can help lower your blood pressure. You may need medicine to help lower your blood pressure if:  Your blood pressure cannot be lowered with healthy choices.  Your blood pressure is higher than 130/80.  Follow these instructions at home: Eating and drinking  If directed, follow the DASH eating plan. This diet includes: ? Filling half of your plate at each meal with fruits and vegetables. ? Filling one quarter of your plate at each meal with whole grains. Whole grains include whole wheat pasta, brown rice, and whole grain bread. ? Eating or drinking low-fat dairy products, such as skim milk or low-fat yogurt. ? Filling one quarter of your plate at each meal with low-fat (lean) proteins. Low-fat proteins include fish, skinless chicken, eggs, beans, and tofu. ? Avoiding fatty meat, cured and processed meat, or chicken with skin. ? Avoiding premade or processed food.  Eat less than 1,500 mg of salt (sodium) a day.  Limit alcohol use to no more than 1 drink a day for nonpregnant women and 2 drinks a day for men. One drink equals 12 oz of beer, 5 oz of wine, or 1 oz of hard liquor. Lifestyle  Work with your doctor to stay at a healthy weight or to lose weight. Ask your doctor what the best weight is for you.  Get at  least 30 minutes of exercise that causes your heart to beat faster (aerobic exercise) most days of the week. This may include walking, swimming, or biking.  Get at least 30 minutes of exercise that strengthens your muscles (resistance exercise) at least 3 days a week. This may include lifting weights or pilates.  Do not use any products that contain nicotine or tobacco. This includes cigarettes and e-cigarettes. If you need help quitting, ask your doctor.  Check your blood pressure at home as told by your doctor.  Keep all follow-up visits as told by your doctor. This is important. Medicines  Take over-the-counter and prescription medicines only as told by your doctor. Follow directions carefully.  Do not skip doses of blood pressure medicine. The medicine does not work as well if you skip doses. Skipping doses also puts you at risk for problems.  Ask your doctor about side effects or reactions to medicines that you should watch for. Contact a doctor if:  You think you are having a reaction to the medicine you are taking.  You have headaches that keep coming back (recurring).  You feel dizzy.  You have swelling in your ankles.  You have trouble with your vision. Get help right away if:  You get a very bad headache.  You start to feel confused.  You feel weak or numb.  You feel faint.  You get very bad pain in your: ? Chest. ? Belly (abdomen).  You throw up (vomit) more than once.  You have trouble breathing. Summary  Hypertension is another name for high blood pressure.  Making healthy choices can help lower blood pressure. If your blood pressure cannot be controlled with healthy choices, you may need to take medicine. This information is not intended to replace advice given to you by your health care provider. Make sure you discuss any questions you have with your health care provider. Document Released: 12/07/2007 Document Revised: 05/18/2016 Document Reviewed:  05/18/2016 Elsevier Interactive Patient Education  [employer].

## 2017-03-20 NOTE — Progress Notes (Signed)
Patient ID: Todd Osborn, male    DOB: March 15, 1961, 56 y.o.   MRN: [id number]  PCP: Scot Jun, FNP Chief Complaint  Patient presents with  . Follow-up    4 WEEKS    Subjective:  HPI Todd Osborn is a 56 y.o. male presents for evaluation of 4 week hypertension follow-up. Todd Osborn was seen in office on 02/20/2017 and started on antihypertension medication. He returned 1 week later for a blood pressure recheck, BP ws not at goal 142/70, and he requested to change medicaion as the amlodipine was causing him to experience nose bleeds. Amlodipine was discontinued and he was started on Losartan 50 mg. Today Todd Osborn reports that Losartan also caused nose bleeds and he has stopped taking all blood pressure medications. He denies headaches, dizziness, shortness of breath, or chest pain. Todd Osborn recently had a positive cologuard stool test and has been scheduled for a colonoscopy with Dr. Wilfrid Lund at [facility].  Social History   Social History  . Marital status: Married    Spouse name: N/A  . Number of children: N/A  . Years of education: N/A   Occupational History  . Not on file.   Social History Main Topics  . Smoking status: Former Smoker    Types: Cigarettes  . Smokeless tobacco: Never Used  . Alcohol use Yes  . Drug use: No  . Sexual activity: Not on file   Other Topics Concern  . Not on file   Social History Narrative  . No narrative on file    Family History  Problem Relation Age of Onset  . Cancer Mother   . Cancer Father    Review of Systems See HPI  Patient Active Problem List   Diagnosis Date Noted  . Colon abnormality Positive COLOGUARD **POSITIVE** 03/16/2017     Prior to Admission medications   Medication Sig Start Date End Date Taking? Authorizing Provider  aspirin 81 MG chewable tablet Chew by mouth daily.   Yes [provider]  cephALEXin (KEFLEX) 500 MG capsule Take 1 capsule (500 mg total) by mouth 4 (four) times  daily. Patient not taking: Reported on 03/20/2017 02/14/17   Dorie Rank, MD  hydrochlorothiazide (HYDRODIURIL) 12.5 MG tablet Take 1 tablet (12.5 mg total) by mouth daily. Patient not taking: Reported on 03/20/2017 02/20/17   Scot Jun, FNP  HYDROcodone-acetaminophen (NORCO/VICODIN) 5-325 MG tablet Take 1 tablet by mouth every 4 (four) hours as needed. Patient not taking: Reported on 02/17/2017 02/11/17   Isla Pence, MD  ibuprofen (ADVIL,MOTRIN) 200 MG tablet Take 400-600 mg by mouth every 6 (six) hours as needed (pain).    [provider]  losartan (COZAAR) 50 MG tablet Take 1 tablet (50 mg total) by mouth daily. Patient not taking: Reported on 03/20/2017 03/03/17   Scot Jun, FNP  oxyCODONE-acetaminophen (PERCOCET/ROXICET) 5-325 MG tablet Take 1 tablet by mouth every 4 (four) hours as needed for moderate pain or severe pain. Patient not taking: Reported on 02/14/2017 10/10/16   Clayton Bibles, PA-C    Past Medical, Surgical Family and Social History reviewed and updated.    Objective:   Today's Vitals   03/20/17 1447  BP: 134/60  Pulse: 67  Resp: 14  Temp: 97.6 F (36.4 C)  TempSrc: Oral  SpO2: 98%  Weight: 289 lb (131.1 kg)  Height: 6' (1.829 m)    Wt Readings from Last 3 Encounters:  03/20/17 289 lb (131.1 kg)  02/20/17 288 lb (130.6 kg)  02/17/17 288 lb (130.6 kg)   Physical Exam  Constitutional: He is oriented to person, place, and time. He appears well-developed and well-nourished.  HENT:  Head: Normocephalic and atraumatic.  Eyes: Pupils are equal, round, and reactive to light. Conjunctivae and EOM are normal.  Neck: Normal range of motion. Neck supple.  Cardiovascular: Normal rate, regular rhythm, normal heart sounds and intact distal pulses.   Pulmonary/Chest: Effort normal and breath sounds normal.  Abdominal: Soft. Bowel sounds are normal.  Musculoskeletal: Normal range of motion.  Neurological: He is alert and oriented to person, place, and  time.  Skin: Skin is warm and dry.  Psychiatric: He has a normal mood and affect. His behavior is normal. Judgment and thought content normal.   Assessment & Plan:  1. Essential hypertension-BP today stable. Patient would like to remain off antihypertensive medications for now and closely monitor his BP readings. He is encouraged to resume Losartan 50 mg if blood pressure is greater than 140/90, consistently. Marlan is scheduled for a initial visit with cardiology 04/13/2017 for abnormal EKG and atypical chest pain.   -The patient was given clear instructions to go to ER or return to medical center if symptoms do not improve, worsen or new problems develop. The patient verbalized understanding.  RTC: 3 months chronic conditions follow-up  Patient provided [facility] Patient Assistance Application as he reports his health insurance will end at the end of September.  Carroll Sage. [name], MSN, FNP-C [facility]  [name] [place], [place] [address] [phone]

## 2017-04-12 DIAGNOSIS — R9431 Abnormal electrocardiogram [ECG] [EKG]: Secondary | ICD-10-CM | POA: Insufficient documentation

## 2017-04-12 DIAGNOSIS — R079 Chest pain, unspecified: Secondary | ICD-10-CM | POA: Insufficient documentation

## 2017-04-13 ENCOUNTER — Encounter: Payer: Self-pay | Admitting: Interventional Cardiology

## 2017-04-13 ENCOUNTER — Ambulatory Visit (INDEPENDENT_AMBULATORY_CARE_PROVIDER_SITE_OTHER): Payer: Self-pay | Admitting: Interventional Cardiology

## 2017-04-13 VITALS — BP 132/74 | HR 60 | Ht 72.0 in | Wt 289.8 lb

## 2017-04-13 DIAGNOSIS — R0683 Snoring: Secondary | ICD-10-CM

## 2017-04-13 DIAGNOSIS — R9431 Abnormal electrocardiogram [ECG] [EKG]: Secondary | ICD-10-CM

## 2017-04-13 DIAGNOSIS — R030 Elevated blood-pressure reading, without diagnosis of hypertension: Secondary | ICD-10-CM

## 2017-04-13 DIAGNOSIS — R079 Chest pain, unspecified: Secondary | ICD-10-CM

## 2017-04-13 NOTE — Progress Notes (Signed)
Cardiology Office Note    Date:  04/13/2017   ID:  Todd Osborn, DOB 28-Aug-1960, MRN [id number]  PCP:  Scot Jun, FNP  Cardiologist: Sinclair Grooms, MD   Chief Complaint  Patient presents with  . Advice Only    Abnormal EKG  . Chest Pain    History of Present Illness:  Todd Osborn is a 56 y.o. male is referred by Molli Barrows, FNP, for evaluation of chest pain and abnormal EKG. Also recent documentation of elevated blood pressure.  He is accompanied by his wife. He works in the [employer] business. Job is very active. He overall feels relatively well. He has some mild dyspnea on exertion but routinely able to climb stairs, and carry heavy glass pains without chest discomfort or limitation. His exertional tolerance is stable now compared to one or 2 years ago.  He complains occasionally of chest pressure that develops usually when he is watching television. The intensity is 4 of 10. If he belches/burps, the discomfort goes away. No episode is lasting longer than one to 2 minutes. There is no associated diaphoresis, radiation, or dyspnea. Physical activity does not produce the complaint.  Recent determination that led pressures run high. Low-dose losartan, 50 mg per day was started. He felt that the medication caused his nose to bleed. He and provider [name] decided to stop the medication. According to the patient the nosebleeds have resolved.   Past Medical History:  Diagnosis Date  . Colon abnormality Positive COLOGUARD **POSITIVE** 03/16/2017  . History of cellulitis   . Hypertension     No past surgical history on file.  Current Medications: Outpatient Medications Prior to Visit  Medication Sig Dispense Refill  . aspirin 81 MG chewable tablet Chew 81 mg by mouth daily.     . cephALEXin (KEFLEX) 500 MG capsule Take 1 capsule (500 mg total) by mouth 4 (four) times daily. (Patient not taking: Reported on 03/20/2017) 28 capsule 0  .  HYDROcodone-acetaminophen (NORCO/VICODIN) 5-325 MG tablet Take 1 tablet by mouth every 4 (four) hours as needed. (Patient not taking: Reported on 02/17/2017) 10 tablet 0  . ibuprofen (ADVIL,MOTRIN) 200 MG tablet Take 400-600 mg by mouth every 6 (six) hours as needed (pain).    [name] losartan (COZAAR) 50 MG tablet Take 1 tablet (50 mg total) by mouth daily. (Patient not taking: Reported on 03/20/2017) 90 tablet 3  . oxyCODONE-acetaminophen (PERCOCET/ROXICET) 5-325 MG tablet Take 1 tablet by mouth every 4 (four) hours as needed for moderate pain or severe pain. (Patient not taking: Reported on 02/14/2017) 15 tablet 0   No facility-administered medications prior to visit.      Allergies:   Penicillins   Social History   Social History  . Marital status: Married    Spouse name: N/A  . Number of children: N/A  . Years of education: N/A   Social History Main Topics  . Smoking status: Former Smoker    Types: Cigarettes  . Smokeless tobacco: Never Used  . Alcohol use Yes  . Drug use: No  . Sexual activity: Not Asked   Other Topics Concern  . None   Social History Narrative  . None     Family History:  The patient's family history includes Cancer in his father and mother.   ROS:   Please see the history of present illness.    Snores loudly. He has trace ankle swelling on occasion.  All other systems reviewed and are negative.  PHYSICAL EXAM:   VS:  BP 132/74 (BP Location: Left Arm)   Pulse 60   Ht 6' (1.829 m)   Wt 289 lb 12.8 oz (131.5 kg)   BMI 39.30 kg/m    GEN: Well nourished, well developed, in no acute distress. Morbidly obese.  HEENT: normal  Neck: no JVD, carotid bruits, or masses Cardiac: RRR; no murmurs, rubs, or gallops,no edema  Respiratory:  clear to auscultation bilaterally, normal work of breathing GI: soft, nontender, nondistended, + BS MS: no deformity or atrophy  Skin: warm and dry, no rash Neuro:  Alert and Oriented x 3, Strength and sensation are  intact Psych: euthymic mood, full affect  Wt Readings from Last 3 Encounters:  04/13/17 289 lb 12.8 oz (131.5 kg)  03/20/17 289 lb (131.1 kg)  02/20/17 288 lb (130.6 kg)      Studies/Labs Reviewed:   EKG:  EKG  [facility] demonstrated normal sinus rhythm, left atrial abnormality, 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 Tracing was performed on 02/20/2017.  Recent Labs: 02/20/2017: ALT 16; BUN 11; Creat 0.86; Hemoglobin 13.2; Platelets 310; Potassium 4.0; Sodium 142; TSH 1.07   Lipid Panel No results found for: CHOL, TRIG, HDL, CHOLHDL, VLDL, LDLCALC, LDLDIRECT  Additional studies/ records that were reviewed today include:  No cardiac functional data.    ASSESSMENT:    1. Chest pain, unspecified type   2. Abnormal ECG   3. Morbid obesity ([place])   4. Snoring   5. Elevated blood-pressure reading, without diagnosis of hypertension      PLAN:  In order of problems listed above:  1. Chest discomfort is very atypical in that episodes of very brief, relieved by belching, and nonexertional. Clinical observation is recommended at this time. I will further recommend profiling the patient's lipid profile and targeting LDL of 100 or less. 2. 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 No workup is needed. 3. I encouraged the patient to decrease caloric intake along with increasing aerobic activity. Weight reduction will be essential to avoid future cardiac morbidity. 4. Consider sleep apnea. We'll leave this to the patient's primary care team. Consider a sleep study. 5. Blood pressure today on at least 2 determinations was reasonable and therefore we did not reinstitute antihypertensive therapy. Target blood pressure should be 140/90 mmHg a less.    Medication  Adjustments/Labs and Tests Ordered: Current medicines are reviewed at length with the patient today.  Concerns regarding medicines are outlined above.  Medication changes, Labs and Tests ordered today are listed in the Patient Instructions below. Patient Instructions  Medication Instructions:  Your physician recommends that you continue on your current medications as directed. Please refer to the Current Medication list given to you today.  Labwork: None  Testing/Procedures: None  Follow-Up: Your physician recommends that you schedule a follow-up appointment as needed with Dr. Tamala Julian.    Any Other Special Instructions Will Be Listed Below (If Applicable).     If you need a refill on your cardiac medications before your next appointment, please call your pharmacy.      Signed, Sinclair Grooms, MD  04/13/2017 9:45 AM    [facility] [place], [place], [place]  [address] Phone: [phone]; Fax: [phone]

## 2017-04-13 NOTE — Patient Instructions (Signed)
Medication Instructions:  Your physician recommends that you continue on your current medications as directed. Please refer to the Current Medication list given to you today.  Labwork: None  Testing/Procedures: None  Follow-Up: Your physician recommends that you schedule a follow-up appointment as needed with Dr. Smith.     Any Other Special Instructions Will Be Listed Below (If Applicable).     If you need a refill on your cardiac medications before your next appointment, please call your pharmacy.   

## 2017-04-28 ENCOUNTER — Ambulatory Visit (AMBULATORY_SURGERY_CENTER): Payer: Self-pay | Admitting: *Deleted

## 2017-04-28 VITALS — Ht 72.0 in | Wt 289.2 lb

## 2017-04-28 DIAGNOSIS — R195 Other fecal abnormalities: Secondary | ICD-10-CM

## 2017-04-28 MED ORDER — NA SULFATE-K SULFATE-MG SULF 17.5-3.13-1.6 GM/177ML PO SOLN: 1.0000 | Freq: Once | ORAL | 0 refills | Status: AC

## 2017-04-28 NOTE — Progress Notes (Signed)
Denies allergies to eggs or soy products. Denies complications with sedation or anesthesia. Denies O2 use. Denies use of diet or weight loss medications.  Emmi instructions given for colonoscopy.  

## 2017-05-08 ENCOUNTER — Encounter: Payer: Self-pay | Admitting: Gastroenterology

## 2017-05-12 ENCOUNTER — Other Ambulatory Visit: Payer: Self-pay

## 2017-05-12 ENCOUNTER — Encounter: Payer: Self-pay | Admitting: Gastroenterology

## 2017-05-12 ENCOUNTER — Ambulatory Visit (AMBULATORY_SURGERY_CENTER): Payer: 59 | Admitting: Gastroenterology

## 2017-05-12 VITALS — BP 131/69 | HR 59 | Temp 98.9°F | Resp 12 | Ht 72.0 in | Wt 289.0 lb

## 2017-05-12 DIAGNOSIS — D125 Benign neoplasm of sigmoid colon: Secondary | ICD-10-CM

## 2017-05-12 DIAGNOSIS — K635 Polyp of colon: Secondary | ICD-10-CM

## 2017-05-12 DIAGNOSIS — D129 Benign neoplasm of anus and anal canal: Secondary | ICD-10-CM

## 2017-05-12 DIAGNOSIS — K621 Rectal polyp: Secondary | ICD-10-CM | POA: Diagnosis not present

## 2017-05-12 DIAGNOSIS — D122 Benign neoplasm of ascending colon: Secondary | ICD-10-CM | POA: Diagnosis not present

## 2017-05-12 DIAGNOSIS — R195 Other fecal abnormalities: Secondary | ICD-10-CM

## 2017-05-12 DIAGNOSIS — D128 Benign neoplasm of rectum: Secondary | ICD-10-CM

## 2017-05-12 MED ORDER — SODIUM CHLORIDE 0.9 % IV SOLN: 500.0000 mL | INTRAVENOUS | Status: DC

## 2017-05-12 NOTE — Progress Notes (Signed)
Called to room to assist during endoscopic procedure.  Patient ID and intended procedure confirmed with present staff. Received instructions for my participation in the procedure from the performing physician.  

## 2017-05-12 NOTE — Op Note (Signed)
[facility] Patient Name: Todd Osborn Procedure Date: 05/12/2017 8:33 AM MRN: [id number] Endoscopist: Mallie Mussel L. Loletha Carrow , MD Age: 56 Referring MD:  Date of Birth: 04-Feb-1961 Gender: Male Account #: [id number] Procedure:                Colonoscopy Indications:              Positive Cologuard test Medicines:                Monitored Anesthesia Care Procedure:                Pre-Anesthesia Assessment:                           - Prior to the procedure, a History and Physical                            was performed, and patient medications and                            allergies were reviewed. The patient's tolerance of                            previous anesthesia was also reviewed. The risks                            and benefits of the procedure and the sedation                            options and risks were discussed with the patient.                            All questions were answered, and informed consent                            was obtained. Anticoagulants: The patient has taken                            aspirin. It was decided not to withhold this                            medication prior to the procedure. ASA Grade                            Assessment: II - A patient with mild systemic                            disease. After reviewing the risks and benefits,                            the patient was deemed in satisfactory condition to                            undergo the procedure.  After obtaining informed consent, the colonoscope                            was passed under direct vision. Throughout the                            procedure, the patient's blood pressure, pulse, and                            oxygen saturations were monitored continuously. The                            Colonoscope was introduced through the anus and                            advanced to the the cecum, identified by   appendiceal orifice and ileocecal valve. The                            colonoscopy was performed without difficulty. The                            patient tolerated the procedure well. The quality                            of the bowel preparation was good. The ileocecal                            valve, appendiceal orifice, and rectum were                            photographed. The quality of the bowel preparation                            was evaluated using the BBPS [facility] Bowel                            Preparation Scale) with scores of: Right Colon = 2,                            Transverse Colon = 2 and Left Colon = 2. The total                            BBPS score equals 6. The bowel preparation used was                            SUPREP. Scope In: 8:42:06 AM Scope Out: 9:01:19 AM Scope Withdrawal Time: 0 hours 17 minutes 25 seconds  Total Procedure Duration: 0 hours 19 minutes 13 seconds  Findings:                 The perianal and digital rectal examinations were  normal.                           Two sessile polyps were found in the ascending                            colon. The polyps were 2 mm in size. These polyps                            were removed with a cold biopsy forceps. Resection                            and retrieval were complete.                           Two sessile and semi-pedunculated polyps were found                            in the rectum and sigmoid colon. The polyps were 4                            to 6 mm in size. These polyps were removed with a                            hot snare. Resection and retrieval were complete.                           Multiple diverticula were found in the entire colon.                           Internal hemorrhoids were found. The hemorrhoids                            were Grade I (internal hemorrhoids that do not                            prolapse).                           The  exam was otherwise without abnormality on                            direct and retroflexion views. Complications:            No immediate complications. Estimated Blood Loss:     Estimated blood loss was minimal. Impression:               - Two 2 mm polyps in the ascending colon, removed                            with a cold biopsy forceps. Resected and retrieved.                           - Two 4 to 6 mm polyps in the rectum and in the  sigmoid colon, removed with a hot snare. Resected                            and retrieved.                           - Diverticulosis in the entire examined colon.                           - Internal hemorrhoids.                           - The examination was otherwise normal on direct                            and retroflexion views. Recommendation:           - Patient has a contact number available for                            emergencies. The signs and symptoms of potential                            delayed complications were discussed with the                            patient. Return to normal activities tomorrow.                            Written discharge instructions were provided to the                            patient.                           - Resume previous diet.                           - No aspirin, ibuprofen, naproxen, or other                            non-steroidal anti-inflammatory drugs for 5 days                            after polyp removal.                           - Await pathology results.                           - Repeat colonoscopy is recommended for                            surveillance. The colonoscopy date will be                            determined after pathology results from today's  exam become available for review.                           - In the future, Cologuard would not be the test of                            choice in this patient  because of his family                            history of colon cancer in both parents in their                            76's. [name] L. Loletha Carrow, MD 05/12/2017 9:12:11 AM This report has been signed electronically.

## 2017-05-12 NOTE — Progress Notes (Signed)
Pt's states no medical or surgical changes since previsit or office visit. 

## 2017-05-12 NOTE — Patient Instructions (Signed)
YOU HAD AN ENDOSCOPIC PROCEDURE TODAY AT [facility]:   Refer to the procedure report that was given to you for any specific questions about what was found during the examination.  If the procedure report does not answer your questions, please call your gastroenterologist to clarify.  If you requested that your care partner not be given the details of your procedure findings, then the procedure report has been included in a sealed envelope for you to review at your convenience later.  YOU SHOULD EXPECT: Some feelings of bloating in the abdomen. Passage of more gas than usual.  Walking can help get rid of the air that was put into your GI tract during the procedure and reduce the bloating. If you had a lower endoscopy (such as a colonoscopy or flexible sigmoidoscopy) you may notice spotting of blood in your stool or on the toilet paper. If you underwent a bowel prep for your procedure, you may not have a normal bowel movement for a few days.  Please Note:  You might notice some irritation and congestion in your nose or some drainage.  This is from the oxygen used during your procedure.  There is no need for concern and it should clear up in a day or so.  SYMPTOMS TO REPORT IMMEDIATELY:   Following lower endoscopy (colonoscopy or flexible sigmoidoscopy):  Excessive amounts of blood in the stool  Significant tenderness or worsening of abdominal pains  Swelling of the abdomen that is new, acute  Fever of 100F or higher   For urgent or emergent issues, a gastroenterologist can be reached at any hour by calling [phone].   DIET:  We do recommend a small meal at first, but then you may proceed to your regular diet.  Drink plenty of fluids but you should avoid alcoholic beverages for 24 hours.  ACTIVITY:  You should plan to take it easy for the rest of today and you should NOT DRIVE or use heavy machinery until tomorrow (because of the sedation medicines used during the test).     FOLLOW UP: Our staff will call the number listed on your records the next business day following your procedure to check on you and address any questions or concerns that you may have regarding the information given to you following your procedure. If we do not reach you, we will leave a message.  However, if you are feeling well and you are not experiencing any problems, there is no need to return our call.  We will assume that you have returned to your regular daily activities without incident.  If any biopsies were taken you will be contacted by phone or by letter within the next 1-3 weeks.  Please call us at [phone] if you have not heard about the biopsies in 3 weeks.    SIGNATURES/CONFIDENTIALITY: You and/or your care partner have signed paperwork which will be entered into your electronic medical record.  These signatures attest to the fact that that the information above on your After Visit Summary has been reviewed and is understood.  Full responsibility of the confidentiality of this discharge information lies with you and/or your care-partner.   Discharge instructions given. Handouts on polyps,diverticulosis and hemorrhoids. NO ASPIRIN, ASPIRIN CONTAINING PRODUCTS (BC OR GOODY POWDERS) OR NSAIDS (IBUPROFEN, ADVIL, ALEVE, AND MOTRIN) FOR 5 days; TYLENOL IS OK TO TAKE

## 2017-05-12 NOTE — Progress Notes (Signed)
A and O x3. Report to RN. Tolerated MAC anesthesia well.

## 2017-05-15 ENCOUNTER — Telehealth: Payer: Self-pay

## 2017-05-15 NOTE — Telephone Encounter (Signed)
  Follow up Call-  Call back number 05/12/2017  Post procedure Call Back phone  # [phone]  Permission to leave phone message Yes  Some recent data might be hidden     Patient questions:  Do you have a fever, pain , or abdominal swelling? No. Pain Score  0 *  Have you tolerated food without any problems? Yes.    Have you been able to return to your normal activities? Yes.    Do you have any questions about your discharge instructions: Diet   No. Medications  No. Follow up visit  No.  Do you have questions or concerns about your Care? No.  Actions: * If pain score is 4 or above: No action needed, pain <4.

## 2017-05-18 ENCOUNTER — Encounter: Payer: Self-pay | Admitting: Gastroenterology

## 2017-06-19 ENCOUNTER — Encounter: Payer: Self-pay | Admitting: Family Medicine

## 2017-06-19 ENCOUNTER — Ambulatory Visit: Payer: 59 | Admitting: Family Medicine

## 2017-06-19 VITALS — BP 128/72 | HR 64 | Temp 97.8°F | Resp 14 | Ht 72.0 in | Wt 291.0 lb

## 2017-06-19 DIAGNOSIS — R0683 Snoring: Secondary | ICD-10-CM | POA: Diagnosis not present

## 2017-06-19 DIAGNOSIS — E669 Obesity, unspecified: Secondary | ICD-10-CM | POA: Diagnosis not present

## 2017-06-19 DIAGNOSIS — R5383 Other fatigue: Secondary | ICD-10-CM | POA: Diagnosis not present

## 2017-06-19 DIAGNOSIS — I10 Essential (primary) hypertension: Secondary | ICD-10-CM

## 2017-06-19 DIAGNOSIS — R7303 Prediabetes: Secondary | ICD-10-CM

## 2017-06-19 LAB — POCT GLYCOSYLATED HEMOGLOBIN (HGB A1C): HEMOGLOBIN A1C: 5.9

## 2017-06-19 NOTE — Progress Notes (Signed)
Patient ID: Todd Osborn, male    DOB: 1961-06-18, 56 y.o.   MRN: [id number]  PCP: Scot Jun, FNP  Chief Complaint  Patient presents with  . Follow-up    evaluation sleep apnea and blood pressure     Subjective:  HPI Todd Osborn is a 56 y.o. male with a history significant for hypertension, obesity,  presents for three month follow-up of chronic conditions. Todd Osborn has a new complaint today of fatigue and concern for sleep apnea.  Braelon reports long history of snoring and frequent nighttime awakenings. Uncertain if he has actually stopped breathing during nighttime rest. Reports daytime fatigue and sleepiness. No prior incidents of falling asleep while driving or while sitting in a public place. No prior evaluations or sleep studies. Risk factors include history of smoking, obesity (Body mass index is 39.47 kg/m.), prediabetes, and hypertension. During Todd Osborn's last office visit he decided to discontinue antihypertensive medication as he was experiencing nose bleed which he attributed to the all antihypertensive medications. Todd Osborn is also a prediabetic with her last A1C 5.7. He continues to remain sedentary. He denies any further episodes of chest pain (which he complained of previously), shortness of breath, dizziness, or weakness.  Social History   Socioeconomic History  . Marital status: Married    Spouse name: Not on file  . Number of children: Not on file  . Years of education: Not on file  . Highest education level: Not on file  Social Needs  . Financial resource strain: Not on file  . Food insecurity - worry: Not on file  . Food insecurity - inability: Not on file  . Transportation needs - medical: Not on file  . Transportation needs - non-medical: Not on file  Occupational History  . Not on file  Tobacco Use  . Smoking status: Former Smoker    Types: Cigarettes    Last attempt to quit: 07/04/1986    Years since quitting: 30.9  . Smokeless tobacco: Never Used   Substance and Sexual Activity  . Alcohol use: Yes    Comment: occasional  . Drug use: Yes    Frequency: 4.0 times per week    Types: Marijuana    Comment: occasional  . Sexual activity: Not on file  Other Topics Concern  . Not on file  Social History Narrative  . Not on file    Family History  Problem Relation Age of Onset  . Cancer Mother   . Colon cancer Mother 68  . Cancer Father   . Colon cancer Father 45  . Esophageal cancer Neg Hx   . Rectal cancer Neg Hx   . Stomach cancer Neg Hx    Review of Systems Constitutional: Negative for fever, chills, diaphoresis, activity change, appetite change and fatigue. HENT: Negative for ear pain, nosebleeds, congestion, facial swelling, rhinorrhea, neck pain, neck stiffness and ear discharge.  Eyes: Negative for pain, discharge, redness, itching and visual disturbance. Respiratory: Negative for cough, choking, chest tightness, shortness of breath, wheezing and stridor.  Cardiovascular: Negative for chest pain, palpitations and leg swelling. Gastrointestinal: Negative for abdominal distention. Genitourinary: Negative for dysuria, urgency, frequency, hematuria, flank pain, decreased urine volume, difficulty urinating and dyspareunia.  Musculoskeletal: Negative for back pain, joint swelling, arthralgia and gait problem. Neurological: Negative for dizziness, tremors, seizures, syncope, facial asymmetry, speech difficulty, weakness, light-headedness, numbness and headaches.  Hematological: Negative for adenopathy. Does not bruise/bleed easily. Psychiatric/Behavioral: Negative for hallucinations, behavioral problems, confusion, dysphoric mood, decreased concentration and agitation.  Patient Active Problem List   Diagnosis Date Noted  . Abnormal ECG 04/12/2017  . Chest pain 04/12/2017  . Colon abnormality Positive COLOGUARD **POSITIVE** 03/16/2017    Allergies  Allergen Reactions  . Penicillins Hives    Has patient had a PCN reaction  causing immediate rash, facial/tongue/throat swelling, SOB or lightheadedness with hypotension: No Has patient had a PCN reaction causing severe rash involving mucus membranes or skin necrosis: No Has patient had a PCN reaction that required hospitalization: No Has patient had a PCN reaction occurring within the last 10 years: No If all of the above answers are "NO", then may proceed with Cephalosporin use. Pt tolerated Keflex in April 2018    Prior to Admission medications   Medication Sig Start Date End Date Taking? Authorizing Provider  aspirin 81 MG chewable tablet Chew 81 mg by mouth daily.    Yes [provider]  ibuprofen (ADVIL,MOTRIN) 200 MG tablet Take 200 mg by mouth as needed.   Yes [provider]    Past Medical, Surgical Family and Social History reviewed and updated.    Objective:   Today's Vitals   06/19/17 1332 06/19/17 1407  BP: (!) 150/70 128/72  Pulse: 64   Resp: 14   Temp: 97.8 F (36.6 C)   TempSrc: Oral   SpO2: 100%   Weight: 291 lb (132 kg)   Height: 6' (1.829 m)     Wt Readings from Last 3 Encounters:  06/19/17 291 lb (132 kg)  05/12/17 289 lb (131.1 kg)  04/28/17 289 lb 3.2 oz (131.2 kg)   Physical Exam Constitutional: Patient appears well-developed and well-nourished. No distress. HENT: Normocephalic, atraumatic, External right and left ear normal. Oropharynx is clear and moist.  Eyes: Conjunctivae and EOM are normal. PERRLA, no scleral icterus. Neck: Normal ROM. Neck supple. No JVD. No tracheal deviation. No thyromegaly. CVS: RRR, S1/S2 +, no murmurs, no gallops, no carotid bruit.  Pulmonary: Effort and breath sounds normal, no stridor, rhonchi, wheezes, rales.  Abdominal: Soft. BS +, no distension, tenderness, rebound or guarding.  Musculoskeletal: Normal range of motion. No edema and no tenderness.  Lymphadenopathy: No lymphadenopathy noted, cervical, inguinal or axillary Neuro: Alert. Normal reflexes, muscle tone  coordination. No cranial nerve deficit. Skin: Skin is warm and dry. No rash noted. Not diaphoretic. No erythema. No pallor. Psychiatric: Normal mood and affect. Behavior, judgment, thought content normal.  Assessment & Plan:  1. Prediabetes, remains stable, although A1C increased 5.9. Aim to reduce intake of soft drinks, substitute with water. Aim for 2-3 Carb Choices per meal (30-45 grams) +/- 1 either way  Aim for 0-15 Carbs per snack if hungry. Include protein in moderation with your meals and snacks  Consider reading food labels for Total Carbohydrate and Fat Grams of foods  Increase physical activity with a goal of 150 minutes per week.  2. Obesity (BMI 30-39.9), chronic ongoing, encouraged physical activity, dietary carbohydrates modifications in order to achieve weight loss. Risk factor for sleep apnea. Split sleep study ordered.  3. Snoring 4. Fatigue, unspecified type Epworth sleepiness scale score 9-abnormal. Risk factors for sleep apnea includes obesity, prediabetes, and snoring.  Obtaining split sleep study to evaluate sleep apnea.  5. Hypertension, remains controlled and stable without antihypertension medication.  Will continue to defer resumption of antihypertensive medication.  We have discussed target BP range and blood pressure goal. I have advised patient to check BP regularly and to call us back or report to clinic if the numbers are  consistently higher than 140/90. We discussed the importance of compliance with DASH diet recommended, consequences of uncontrolled hypertension discussed.    Orders Placed This Encounter  Procedures  . POCT glycosylated hemoglobin (Hb A1C)  . POCT urinalysis dip (device)  . Split night study    RTC: 6 months for chronic condition management   Carroll Sage. [name], MSN, FNP-C [facility]  [name] [place], [place] [address] [phone]   '

## 2017-06-19 NOTE — Patient Instructions (Addendum)
Saline nasal at bedtimes to prevent nasal drying.  Periodically check your blood pressure as your goal BP reading should be less than 140/80.  Your A1C is slightly increased to 5.9. Reduce intake of sugary drinks and aim for 150 minutes of exercise per week.  The sleep center will contact you to scheduled your appointment.     Prediabetes Prediabetes is the condition of having a blood sugar (blood glucose) level that is higher than it should be, but not high enough for you to be diagnosed with type 2 diabetes. Having prediabetes puts you at risk for developing type 2 diabetes (type 2 diabetes mellitus). Prediabetes may be called impaired glucose tolerance or impaired fasting glucose. Prediabetes usually does not cause symptoms. Your health care provider can diagnose this condition with blood tests. You may be tested for prediabetes if you are overweight and if you have at least one other risk factor for prediabetes. Risk factors for prediabetes include:  Having a family member with type 2 diabetes.  Being overweight or obese.  Being older than age 29.  Being of American-Indian, African-American, Hispanic/Latino, or Asian/Pacific Islander descent.  Having an inactive (sedentary) lifestyle.  Having a history of gestational diabetes or polycystic ovarian syndrome (PCOS).  Having low levels of good cholesterol (HDL-C) or high levels of blood fats (triglycerides).  Having high blood pressure.  What is blood glucose and how is blood glucose measured?  Blood glucose refers to the amount of glucose in your bloodstream. Glucose comes from eating foods that contain sugars and starches (carbohydrates) that the body breaks down into glucose. Your blood glucose level may be measured in mg/dL (milligrams per deciliter) or mmol/L (millimoles per liter).Your blood glucose may be checked with one or more of the following blood tests:  A fasting blood glucose (FBG) test. You will not be allowed to  eat (you will fast) for at least 8 hours before a blood sample is taken. ? A normal range for FBG is 70-100 mg/dl (3.9-5.6 mmol/L).  An A1c (hemoglobin A1c) blood test. This test provides information about blood glucose control over the previous 2?92months.  An oral glucose tolerance test (OGTT). This test measures your blood glucose twice: ? After fasting. This is your baseline level. ? Two hours after you drink a beverage that contains glucose.  You may be diagnosed with prediabetes:  If your FBG is 100?125 mg/dL (5.6-6.9 mmol/L).  If your A1c level is 5.7?6.4%.  If your OGGT result is 140?199 mg/dL (7.8-11 mmol/L).  These blood tests may be repeated to confirm your diagnosis. What happens if blood glucose is too high? The pancreas produces a hormone (insulin) that helps move glucose from the bloodstream into cells. When cells in the body do not respond properly to insulin that the body makes (insulin resistance), excess glucose builds up in the blood instead of going into cells. As a result, high blood glucose (hyperglycemia) can develop, which can cause many complications. This is a symptom of prediabetes. What can happen if blood glucose stays higher than normal for a long time? Having high blood glucose for a long time is dangerous. Too much glucose in your blood can damage your nerves and blood vessels. Long-term damage can lead to complications from diabetes, which may include:  Heart disease.  Stroke.  Blindness.  Kidney disease.  Depression.  Poor circulation in the feet and legs, which could lead to surgical removal (amputation) in severe cases.  How can prediabetes be prevented from turning into type  2 diabetes?  To help prevent type 2 diabetes, take the following actions:  Be physically active. ? Do moderate-intensity physical activity for at least 30 minutes on at least 5 days of the week, or as much as told by your health care provider. This could be brisk  walking, biking, or water aerobics. ? Ask your health care provider what activities are safe for you. A mix of physical activities may be best, such as walking, swimming, cycling, and strength training.  Lose weight as told by your health care provider. ? Losing 5-7% of your body weight can reverse insulin resistance. ? Your health care provider can determine how much weight loss is best for you and can help you lose weight safely.  Follow a healthy meal plan. This includes eating lean proteins, complex carbohydrates, fresh fruits and vegetables, low-fat dairy products, and healthy fats. ? Follow instructions from your health care provider about eating or drinking restrictions. ? Make an appointment to see a diet and nutrition specialist (registered dietitian) to help you create a healthy eating plan that is right for you.  Do not smoke or use any tobacco products, such as cigarettes, chewing tobacco, and e-cigarettes. If you need help quitting, ask your health care provider.  Take over-the-counter and prescription medicines as told by your health care provider. You may be prescribed medicines that help lower the risk of type 2 diabetes.  This information is not intended to replace advice given to you by your health care provider. Make sure you discuss any questions you have with your health care provider. Document Released: 10/12/2015 Document Revised: 11/26/2015 Document Reviewed: 08/11/2015 Elsevier Interactive Patient Education  [employer].

## 2017-06-21 LAB — POCT URINALYSIS DIP (DEVICE)
BILIRUBIN URINE: NEGATIVE
Glucose, UA: NEGATIVE mg/dL
HGB URINE DIPSTICK: NEGATIVE
KETONES UR: NEGATIVE mg/dL
Leukocytes, UA: NEGATIVE
Nitrite: NEGATIVE
PH: 5.5 (ref 5.0–8.0)
Protein, ur: NEGATIVE mg/dL
SPECIFIC GRAVITY, URINE: 1.025 (ref 1.005–1.030)
Urobilinogen, UA: 0.2 mg/dL (ref 0.0–1.0)

## 2017-06-30 ENCOUNTER — Telehealth: Payer: Self-pay

## 2017-06-30 DIAGNOSIS — Z9189 Other specified personal risk factors, not elsewhere classified: Secondary | ICD-10-CM

## 2017-06-30 NOTE — Telephone Encounter (Signed)
Patient sleep study was denied by insurance

## 2017-07-04 NOTE — Telephone Encounter (Signed)
Home sleep study ordered. Please follow-up with patient to advise home sleep study is the only form covered by insurance.   Carroll Sage. [name], MSN, FNP-C [facility]  [name] [place], [place] [address] [phone]

## 2017-07-05 NOTE — Telephone Encounter (Signed)
Left a vm for patient to callback 

## 2017-07-05 NOTE — Telephone Encounter (Signed)
Patient contacted by sleep study already and has appointment scheduled.

## 2017-07-21 ENCOUNTER — Ambulatory Visit (HOSPITAL_BASED_OUTPATIENT_CLINIC_OR_DEPARTMENT_OTHER): Payer: 59 | Attending: Family Medicine | Admitting: Internal Medicine

## 2017-07-21 DIAGNOSIS — G4733 Obstructive sleep apnea (adult) (pediatric): Secondary | ICD-10-CM | POA: Insufficient documentation

## 2017-08-05 DIAGNOSIS — G4733 Obstructive sleep apnea (adult) (pediatric): Secondary | ICD-10-CM | POA: Diagnosis not present

## 2017-08-05 NOTE — Procedures (Signed)
   Patient Name: Todd Osborn, Todd Osborn Date: 07/22/2017 Gender: Male D.O.B: Apr 07, 1961 Age (years): 26 Referring Provider: Molli Barrows FNP Height (inches): 70 Interpreting Physician: Baird Lyons MD, ABSM Weight (lbs): 287 RPSGT: Jacolyn Reedy BMI: 34 MRN: [id number] Neck Size: 20.00 <br> <br> CLINICAL INFORMATION Sleep Study Type: HST  Indication for sleep study: Excessive Daytime Sleepiness (780.79), Snoring (786.09)  Epworth Sleepiness Score: 10  SLEEP STUDY TECHNIQUE A multi-channel overnight portable sleep study was performed. The channels recorded were: nasal airflow, thoracic respiratory movement, and oxygen saturation with a pulse oximetry. Snoring was also monitored.  MEDICATIONS Patient self administered medications include: none reported.  SLEEP ARCHITECTURE Patient was studied for 400.5 minutes. The sleep efficiency was 96.6 % and the patient was supine for 21.1%. The arousal index was 0.0 per hour.  RESPIRATORY PARAMETERS The overall AHI was 12.3 per hour, with a central apnea index of 0.0 per hour. The oxygen nadir was 84% during sleep.  CARDIAC DATA Mean heart rate during sleep was 61.0 bpm.  IMPRESSIONS - Mild obstructive sleep apnea occurred during this study (AHI = 12.3/h). - No significant central sleep apnea occurred during this study (CAI = 0.0/h). - Moderate oxygen desaturation was noted during this study (Min O2 = 84%). - Patient snored.  DIAGNOSIS - Obstructive Sleep Apnea (327.23 [G47.33 ICD-10])  RECOMMENDATIONS - CPAP titration or AutoPAP, or a fitted oral appliance would commonly be considered. Other options would be based on clinical judgment. - Be careful with alcohol, sedatives and other CNS depressants that may worsen sleep apnea and disrupt normal sleep architecture. - Sleep hygiene should be reviewed to assess factors that may improve sleep quality. - Weight management and regular exercise should be initiated or  continued.  [Electronically signed] 08/05/2017 10:09 AM  Baird Lyons MD, ABSM Diplomate, American Board of Sleep Medicine   NPI: [id number]                          [place] [place] [facility]  ELECTRONICALLY SIGNED ON:  08/05/2017, 10:07 AM [place] PH: [phone]   FX: [phone] [place]

## 2017-08-09 ENCOUNTER — Telehealth: Payer: Self-pay | Admitting: Family Medicine

## 2017-08-09 NOTE — Telephone Encounter (Signed)
Left a vm for patient to callback 

## 2017-08-09 NOTE — Telephone Encounter (Signed)
Patient notified

## 2017-08-09 NOTE — Telephone Encounter (Signed)
Contact patient to advise his recent home sleep study was significant for sleep apnea.  I will place an order for CPAP machine.  He should allow at least 2-3 weeks for receipt of device.  If he has not received anything within that timeframe follow-up here at the office.    Carroll Sage. [name], MSN, FNP-C [facility]  [name] [place], [place] [address] [phone]

## 2017-08-09 NOTE — Telephone Encounter (Signed)
Contact patient to advise sleep study was significant for obstructive sleep apnea. I am placing an order for CPAP machine.  Carroll Sage. [name], MSN, FNP-C [facility]  [name] [place], [place] [address] [phone]

## 2017-08-10 NOTE — Telephone Encounter (Signed)
Patient notified

## 2017-09-04 ENCOUNTER — Telehealth: Payer: Self-pay

## 2017-09-04 DIAGNOSIS — G4733 Obstructive sleep apnea (adult) (pediatric): Secondary | ICD-10-CM

## 2017-09-04 NOTE — Telephone Encounter (Signed)
Patient states that he needs to be referred back to sleep center for a follow up of his sleep study before they will send his machine.

## 2017-09-04 NOTE — Telephone Encounter (Signed)
Contact the sleep center to inquire if they are aware of what the patient is referring to. Inquire if Todd Osborn is in need of a titration study.  Todd Osborn. Todd Kingfisher, MSN, FNP-C [facility]  [name] [place], [place] [address] [phone]

## 2017-09-05 NOTE — Telephone Encounter (Signed)
Recommendations were for CPAP Titration or Autopap

## 2017-09-05 NOTE — Telephone Encounter (Signed)
Patient notified

## 2017-09-05 NOTE — Telephone Encounter (Signed)
Contact patient to advise. CPAP titration study orders in the system. He will be contacted by the sleep center to schedule an appointment.    Carroll Sage. [name], MSN, FNP-C [facility]  [name] [place], [place] [address] [phone]

## 2017-09-05 NOTE — Addendum Note (Signed)
Addended by: Scot Jun on: 09/05/2017 01:09 PM   Modules accepted: Orders

## 2017-09-18 ENCOUNTER — Telehealth: Payer: Self-pay | Admitting: Family Medicine

## 2017-09-18 ENCOUNTER — Other Ambulatory Visit: Payer: Self-pay

## 2017-09-18 ENCOUNTER — Ambulatory Visit: Payer: 59 | Admitting: Family Medicine

## 2017-09-18 ENCOUNTER — Encounter: Payer: Self-pay | Admitting: Family Medicine

## 2017-09-18 VITALS — BP 143/75 | HR 65 | Temp 97.6°F | Ht 72.0 in | Wt 298.0 lb

## 2017-09-18 DIAGNOSIS — M7731 Calcaneal spur, right foot: Secondary | ICD-10-CM | POA: Diagnosis not present

## 2017-09-18 DIAGNOSIS — B351 Tinea unguium: Secondary | ICD-10-CM

## 2017-09-18 DIAGNOSIS — R7303 Prediabetes: Secondary | ICD-10-CM | POA: Diagnosis not present

## 2017-09-18 LAB — POCT GLYCOSYLATED HEMOGLOBIN (HGB A1C): HEMOGLOBIN A1C: 5.9

## 2017-09-18 MED ORDER — PREDNISONE 20 MG PO TABS: ORAL_TABLET | ORAL | 0 refills | Status: DC

## 2017-09-18 NOTE — Patient Instructions (Signed)
Take Prednisone 20 mg,  in mornings with breakfast as follows:  Take 3 pills for 3 days, Take 2 pills for 3 days, and Take 1 pill for 3 days.  Complete all medication.  I am referring you to podiatry for diabetic foot exam and further evaluation of right heel pain.  Continue orthotics as needed.  Discontinue Advil while taking prednisone.     Heel Spur A heel spur is a bony growth that forms on the bottom of your heel bone (calcaneus). Heel spurs are common and do not always cause pain. However, heel spurs often cause inflammation in the strong band of tissue that runs underneath the bone of your foot (plantar fascia). When this happens, you may feel pain on the bottom of your foot, near your heel. What are the causes? The cause of heel spurs is not completely understood. They may be caused by pressure on the heel. Or, they may stem from the muscle attachments (tendons) near the spur pulling on the heel. What increases the risk? You may be at risk for a heel spur if you:  Are older than 40.  Are overweight.  Have wear and tear arthritis (osteoarthritis).  Have plantar fascia inflammation.  What are the signs or symptoms? Some people have heel spurs but no symptoms. If you do have symptoms, they may include:  Pain in the bottom of your heel.  Pain that is worse when you first get out of bed.  Pain that gets worse after walking or standing.  How is this diagnosed? Your health care provider may diagnose a heel spur based on your symptoms and a physical exam. You may also have an X-ray of your foot to check for a bony growth coming from the calcaneus. How is this treated? Treatment aims to relieve the pain from the heel spur. This may include:  Stretching exercises.  Losing weight.  Wearing specific shoes, inserts, or orthotics for comfort and support.  Wearing splints at night to properly position your feet.  Taking over-the-counter medicine to relieve pain.  Being  treated with high-intensity sound waves to break up the heel spur (extracorporeal shock wave therapy).  Getting steroid injections in your heel to reduce swelling and ease pain.  Having surgery if your heel spur causes long-term (chronic) pain.  Follow these instructions at home:  Take medicines only as directed by your health care provider.  Ask your health care provider if you should use ice or cold packs on the painful areas of your heel or foot.  Avoid activities that cause you pain until you recover or as directed by your health care provider.  Stretch before exercising or being physically active.  Wear supportive shoes that fit well as directed by your health care provider. You might need to buy new shoes. Wearing old shoes or shoes that do not fit correctly may not provide the support that you need.  Lose weight if your health care provider thinks you should. This can relieve pressure on your foot that may be causing pain and discomfort. Contact a health care provider if:  Your pain continues or gets worse. This information is not intended to replace advice given to you by your health care provider. Make sure you discuss any questions you have with your health care provider. Document Released: 07/27/2005 Document Revised: 11/26/2015 Document Reviewed: 08/21/2013 Elsevier Interactive Patient Education  [employer].

## 2017-09-18 NOTE — Telephone Encounter (Signed)
[employer] denied request for titration study. Contact [employer] at [phone] to inquire about request.

## 2017-09-18 NOTE — Progress Notes (Signed)
Patient ID: Todd Osborn, male    DOB: 03-Nov-1960, 57 y.o.   MRN: [id number]  PCP: Scot Jun, FNP  Chief Complaint  Patient presents with  . Foot Pain    x 3 weeks    Subjective:  HPI Todd Osborn is a 57 y.o. male with a history of prediabetes presents for evaluation of right heel foot pain x 3 weeks. Pain is characterized as aching. No history of heels spurs.  He reports the symptoms started approximately 3 weeks ago and has increasingly worsened.  Pain is exacerbated by walking long 10-hour days where he is persistently standing on his feet.  He characterized the pain as a persistent burning sensation.  He has attempted relief with Advil which only temporarily improved symptoms.  He has also purchased orthotics which have not relieved symptoms. He wears work boots and has alternated footwear without significant relief.  He denies any known history of gout, neuropathy, or foot injury. Social History   Socioeconomic History  . Marital status: Married    Spouse name: Not on file  . Number of children: Not on file  . Years of education: Not on file  . Highest education level: Not on file  Social Needs  . Financial resource strain: Not on file  . Food insecurity - worry: Not on file  . Food insecurity - inability: Not on file  . Transportation needs - medical: Not on file  . Transportation needs - non-medical: Not on file  Occupational History  . Not on file  Tobacco Use  . Smoking status: Former Smoker    Types: Cigarettes    Last attempt to quit: 07/04/1986    Years since quitting: 31.2  . Smokeless tobacco: Never Used  Substance and Sexual Activity  . Alcohol use: No    Frequency: Never    Comment: occasional  . Drug use: No    Comment: occasional  . Sexual activity: Not on file  Other Topics Concern  . Not on file  Social History Narrative  . Not on file    Family History  Problem Relation Age of Onset  . Cancer Mother   . Colon cancer Mother 60  . Cancer  Father   . Colon cancer Father 59  . Esophageal cancer Neg Hx   . Rectal cancer Neg Hx   . Stomach cancer Neg Hx    Review of Systems See HPI  Patient Active Problem List   Diagnosis Date Noted  . Abnormal ECG 04/12/2017  . Chest pain 04/12/2017  . Colon abnormality Positive COLOGUARD **POSITIVE** 03/16/2017    Allergies  Allergen Reactions  . Amlodipine     Nosebleed   . Losartan     Nosebleed   . Penicillins Hives    Has patient had a PCN reaction causing immediate rash, facial/tongue/throat swelling, SOB or lightheadedness with hypotension: No Has patient had a PCN reaction causing severe rash involving mucus membranes or skin necrosis: No Has patient had a PCN reaction that required hospitalization: No Has patient had a PCN reaction occurring within the last 10 years: No If all of the above answers are "NO", then may proceed with Cephalosporin use. Pt tolerated Keflex in April 2018    Prior to Admission medications   Medication Sig Start Date End Date Taking? Authorizing Provider  aspirin 81 MG chewable tablet Chew 81 mg by mouth daily.    Yes [provider]  ibuprofen (ADVIL,MOTRIN) 200 MG tablet Take 200 mg by  mouth as needed.   Yes [provider]    Past Medical, Surgical Family and Social History reviewed and updated.    Objective:   Today's Vitals   09/18/17 0828 09/18/17 0829  BP: (!) 143/75   Pulse: 65   Temp: 97.6 F (36.4 C)   TempSrc: Oral   SpO2: 95%   Weight: 298 lb (135.2 kg)   Height: 6' (1.829 m)   PainSc: 7  7   PainLoc: Foot     Wt Readings from Last 3 Encounters:  09/18/17 298 lb (135.2 kg)  06/19/17 291 lb (132 kg)  05/12/17 289 lb (131.1 kg)    Physical Exam  Constitutional: He is oriented to person, place, and time. He appears well-developed and well-nourished.  Cardiovascular: Normal rate.  Pulmonary/Chest: Effort normal.  Musculoskeletal:  Right heel is warm to touch with erythema present.    Neurological: He is alert and oriented to person, place, and time.  Skin: Skin is warm. There is erythema.  Foot exam: Discolored toe nails, cracks in feet, increased warmth of right heel and erythema present.     Assessment & Plan:  1. Prediabetes, A1C stable at 5.9. Bilateral feet are in poor condition with cracking skin and discolored toe nails.  Referring to podiatry for further evaluation and complete diabetic foot exam. Ambulatory referral to Podiatry  2. Heel spur, right, will trial a prednisone taper as patient has failed improvement of pain symptoms with NSAIDS. Suspect new onset heel spur. Take Prednisone 20 mg,  in mornings with breakfast as follows:  Take 3 pills for 3 days, Take 2 pills for 3 days, and Take 1 pill for 3 days.  Complete all medication.  Ambulatory referral to Podiatry  3. Toenail fungus - Ambulatory referral to Podiatry   RTC: PRN    Carroll Sage. [name], MSN, FNP-C [facility]  [name] [place], [place] [address] [phone]

## 2017-09-21 NOTE — Telephone Encounter (Signed)
Per Merrie Roof at [facility] they didn't state why the request was denied but provider will need to call to do a peer to peer at [phone]. TIRW#[id number]

## 2017-09-26 NOTE — Telephone Encounter (Signed)
[place] regarding titration study peer to peer. Titration study was approved as long as study is completed at home. Transferred call to [facility] to obtain authorization number.

## 2017-09-28 ENCOUNTER — Ambulatory Visit: Payer: 59 | Admitting: Podiatry

## 2017-09-28 ENCOUNTER — Encounter: Payer: Self-pay | Admitting: Podiatry

## 2017-09-28 VITALS — BP 145/68 | HR 69 | Ht 72.0 in | Wt 295.0 lb

## 2017-09-28 DIAGNOSIS — R262 Difficulty in walking, not elsewhere classified: Secondary | ICD-10-CM

## 2017-09-28 DIAGNOSIS — M722 Plantar fascial fibromatosis: Secondary | ICD-10-CM | POA: Diagnosis not present

## 2017-09-28 DIAGNOSIS — M216X9 Other acquired deformities of unspecified foot: Secondary | ICD-10-CM | POA: Diagnosis not present

## 2017-09-28 NOTE — Patient Instructions (Signed)
Seen for pain in right foot arch area. Cortisone injection given. Placed in Ankle brace. Return in 2 weeks or Custom orthotic prep.

## 2017-09-28 NOTE — Progress Notes (Signed)
SUBJECTIVE: 57 y.o. year old male presents complaining of pain at right instep x 3 weeks. On feet at work 10+ hours a day. Started on Prednisone pill on last Monday 09/18/17 and finished yesterday.  Review of Systems  Constitutional: Negative for chills, fever and weight loss.  HENT: Negative.   Eyes: Negative.   Respiratory: Negative.   Cardiovascular: Negative.   Gastrointestinal: Negative.   Genitourinary: Negative.   Musculoskeletal: Negative.   Skin: Negative.      OBJECTIVE: DERMATOLOGIC EXAMINATION: Normal findings.  VASCULAR EXAMINATION OF LOWER LIMBS: Pedal pulses are all palpable with normal pulsation.  Capillary Filling times within 3 seconds in all digits.  No edema or erythema noted. Temperature gradient from tibial crest to dorsum of foot is within normal bilateral.  NEUROLOGIC EXAMINATION OF THE LOWER LIMBS: All epicritic and tactile sensations grossly intact. Sharp and Dull discriminatory sensations at the plantar ball of hallux is intact bilateral.   MUSCULOSKELETAL EXAMINATION: Positive for high arched cavus type foot with pain in mid arch closer to heel.  ASSESSMENT: Plantar fasciitis right. Cavovarus foot bilateral.  PLAN: Reviewed clinical findings and available treatment options. Right mid arch injected with mixture of 4 mg Dexamethasone, 4 mg Triamcinolone, and 1 cc of 0.5% Marcaine plain. Patient tolerated well without difficulty.  Right foot placed in ankle brace. Return in 2 weeks to prepare for custom orthotics.

## 2017-10-11 ENCOUNTER — Telehealth: Payer: Self-pay | Admitting: *Deleted

## 2017-10-11 NOTE — Telephone Encounter (Signed)
Insurance does not cover custom molded orthotics. (CPT code X647130) Ref # [id number]  Left message for patient to call back

## 2017-10-12 ENCOUNTER — Ambulatory Visit: Payer: 59 | Admitting: Podiatry

## 2017-10-12 DIAGNOSIS — M21969 Unspecified acquired deformity of unspecified lower leg: Secondary | ICD-10-CM | POA: Diagnosis not present

## 2017-10-12 DIAGNOSIS — M216X9 Other acquired deformities of unspecified foot: Secondary | ICD-10-CM

## 2017-10-12 DIAGNOSIS — M722 Plantar fascial fibromatosis: Secondary | ICD-10-CM | POA: Diagnosis not present

## 2017-10-12 DIAGNOSIS — M7730 Calcaneal spur, unspecified foot: Secondary | ICD-10-CM

## 2017-10-12 MED ORDER — NABUMETONE 500 MG PO TABS: 500.0000 mg | ORAL_TABLET | Freq: Two times a day (BID) | ORAL | 1 refills | Status: DC

## 2017-10-12 NOTE — Progress Notes (Signed)
Subjective: 57 y.o. year old male patient presents for follow up on painful heel. Had much pain on Tuesday after been on feet 12 hours at work. Advil helped. Injection helped for a week. Having issues with fungal nails. Stated that he will be laid off from work in 3 weeks.  Objective: Dermatologic: TVascular: Pedal pulses are all palpable. 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 Short first metatarsal bone bilateral. Neurologic: All epicritic and tactile sensations grossly intact.  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 Positive for plantar and posterior heel spur bilateral.  Assessment: Dystrophic mycotic nails x 10. Plantar fasciitis with heel spur bilateral. Cavovarus foot bilateral. Short first ray bilateral.  Treatment: All mycotic nails debrided and advised to scrub nail with antibacterial soap after each shower. Due to abnormal bone structure Custom orthotics will help to continue current level of activty. Will postpone orthotics till he start on a new job. Return in 3 months or as needed.

## 2017-10-12 NOTE — Patient Instructions (Signed)
Follow up on Plantar fasciitis right foot. Injection and Advil is helping some. Noted of fungal nail with ingrown hallucal nails. All nails debrided.  Periodic debridement and daily scrub with antibacterial soap will help. If indicated, ingrown nail surgery can be done. X-ray examination reveal high arched foot with spur formation in heel bone with short first metatarsal bone, which is causing off balance, misalignment of foot and excess tension and pain in arch and heel area. Custom orthotics would help. If ready for the orthotics please return.

## 2017-10-14 ENCOUNTER — Encounter: Payer: Self-pay | Admitting: Podiatry

## 2017-12-19 ENCOUNTER — Ambulatory Visit (INDEPENDENT_AMBULATORY_CARE_PROVIDER_SITE_OTHER): Payer: Self-pay | Admitting: Family Medicine

## 2017-12-19 ENCOUNTER — Encounter: Payer: Self-pay | Admitting: Family Medicine

## 2017-12-19 VITALS — BP 140/80 | HR 80 | Temp 98.7°F | Ht 72.0 in | Wt 291.0 lb

## 2017-12-19 DIAGNOSIS — M7731 Calcaneal spur, right foot: Secondary | ICD-10-CM

## 2017-12-19 DIAGNOSIS — I10 Essential (primary) hypertension: Secondary | ICD-10-CM

## 2017-12-19 DIAGNOSIS — Z09 Encounter for follow-up examination after completed treatment for conditions other than malignant neoplasm: Secondary | ICD-10-CM

## 2017-12-19 DIAGNOSIS — R7303 Prediabetes: Secondary | ICD-10-CM

## 2017-12-19 LAB — POCT GLYCOSYLATED HEMOGLOBIN (HGB A1C): Hemoglobin A1C: 5.6 % (ref 4.0–5.6)

## 2017-12-19 LAB — POCT URINALYSIS DIP (MANUAL ENTRY)
Bilirubin, UA: NEGATIVE
Blood, UA: NEGATIVE
Glucose, UA: NEGATIVE mg/dL
Ketones, POC UA: NEGATIVE mg/dL
Leukocytes, UA: NEGATIVE
Nitrite, UA: NEGATIVE
Spec Grav, UA: >=1.03 — AB (ref 1.010–1.025)
Urobilinogen, UA: 0.2 E.U./dL
pH, UA: 5 (ref 5.0–8.0)

## 2017-12-19 NOTE — Progress Notes (Signed)
Subjective:    Patient ID: Todd Osborn, male    DOB: [birth year], 57 y.o.   MRN: [id number]   PCP: Kathe Becton, NP  Chief Complaint  Patient presents with  . Follow-up    6 month on chronic condition     HPI  Todd Osborn has a history of Hypertension and Cellulitis. He is here today for his 6 month follow up.   Current Status: He is doing well today with no complaints. He has has a 7 lb weight loss in 3 months. He denies fevers, chills, fatigue, recent infections, and night sweats. He has not had any headaches, visual changes, dizziness, and falls.   He denies chest pain, heart palpitations, cough and shortness of breath reported.   No reports of GI problems. He has no reports of blood in stools, dysuria and hematuria.   No depression or anxiety.   He was previously taking Relafen for pain from his right heel spur. He has no pain or discomfort today.   Past Medical History:  Diagnosis Date  . Colon abnormality Positive COLOGUARD **POSITIVE** 03/16/2017  . History of cellulitis   . Hypertension     Family History  Problem Relation Age of Onset  . Cancer Mother   . Colon cancer Mother 70  . Cancer Father   . Colon cancer Father 21  . Esophageal cancer Neg Hx   . Rectal cancer Neg Hx   . Stomach cancer Neg Hx     Social History   Socioeconomic History  . Marital status: Married    Spouse name: Not on file  . Number of children: Not on file  . Years of education: Not on file  . Highest education level: Not on file  Occupational History  . Not on file  Social Needs  . Financial resource strain: Not on file  . Food insecurity:    Worry: Not on file    Inability: Not on file  . Transportation needs:    Medical: Not on file    Non-medical: Not on file  Tobacco Use  . Smoking status: Former Smoker    Types: Cigarettes    Last attempt to quit: 07/04/1986    Years since quitting: 31.4  . Smokeless tobacco: Never Used  Substance and Sexual Activity  .  Alcohol use: No    Frequency: Never    Comment: occasional  . Drug use: No    Frequency: 4.0 times per week    Types: Marijuana    Comment: occasional  . Sexual activity: Not on file  Lifestyle  . Physical activity:    Days per week: Not on file    Minutes per session: Not on file  . Stress: Not on file  Relationships  . Social connections:    Talks on phone: Not on file    Gets together: Not on file    Attends religious service: Not on file    Active member of club or organization: Not on file    Attends meetings of clubs or organizations: Not on file    Relationship status: Not on file  . Intimate partner violence:    Fear of current or ex partner: Not on file    Emotionally abused: Not on file    Physically abused: Not on file    Forced sexual activity: Not on file  Other Topics Concern  . Not on file  Social History Narrative  . Not on file    History reviewed.  No pertinent surgical history.    There is no immunization history on file for this patient.  Current Meds  Medication Sig  . aspirin 81 MG chewable tablet Chew 81 mg by mouth daily.   [name] ibuprofen (ADVIL,MOTRIN) 200 MG tablet Take 200 mg by mouth as needed.    Allergies  Allergen Reactions  . Amlodipine     Nosebleed   . Losartan     Nosebleed   . Penicillins Hives    Has patient had a PCN reaction causing immediate rash, facial/tongue/throat swelling, SOB or lightheadedness with hypotension: No Has patient had a PCN reaction causing severe rash involving mucus membranes or skin necrosis: No Has patient had a PCN reaction that required hospitalization: No Has patient had a PCN reaction occurring within the last 10 years: No If all of the above answers are "NO", then may proceed with Cephalosporin use. Pt tolerated Keflex in April 2018    BP 140/80 (BP Location: Left Arm, Patient Position: Sitting, Cuff Size: Large)   Pulse 80   Temp 98.7 F (37.1 C) (Oral)   Ht 6' (1.829 m)   Wt 291 lb (132  kg)   SpO2 97%   BMI 39.47 kg/m   Review of Systems  Constitutional: Negative.   HENT: Negative.   Eyes: Negative.   Respiratory: Negative.   Cardiovascular: Negative.   Gastrointestinal: Negative.   Endocrine: Negative.   Genitourinary: Negative.   Musculoskeletal: Negative.   Skin: Negative.   Allergic/Immunologic: Negative.   Neurological: Negative.   Hematological: Negative.   Psychiatric/Behavioral: Negative.    Objective:   Physical Exam  Constitutional: He is oriented to person, place, and time. He appears well-developed and well-nourished.  HENT:  Head: Normocephalic and atraumatic.  Right Ear: External ear normal.  Left Ear: External ear normal.  Nose: Nose normal.  Mouth/Throat: Oropharynx is clear and moist.  Eyes: Pupils are equal, round, and reactive to light. Conjunctivae and EOM are normal.  Neck: Normal range of motion. Neck supple.  Cardiovascular: Normal rate, regular rhythm, normal heart sounds and intact distal pulses.  Pulmonary/Chest: Effort normal and breath sounds normal.  Abdominal: Soft. Bowel sounds are normal.  Musculoskeletal: Normal range of motion.  Neurological: He is alert and oriented to person, place, and time.  Skin: Skin is warm and dry.  Psychiatric: He has a normal mood and affect. His behavior is normal. Judgment and thought content normal.  Nursing note and vitals reviewed.  Assessment & Plan:   1. Prediabetes Hgb A1c is normal at 5.6 today. Urinalysis is stable with trace protein.  - POCT glycosylated hemoglobin (Hb A1C) - POCT urinalysis dipstick  2. Heel spur, right Improved. No complaints of pain or discomfort.  3. Essential hypertension Blood pressure is stable at 140/80 today. He continues to eat DASH diet, decreasing high-fats, increasing fruits, vegetables, water intake, and at least 30 minutes of cardio exercise every day.   4. Follow up He will follow up in 3 months.  No orders of the defined types were placed  in this encounter.   Kathe Becton,  MSN, FNP-BC [facility] [address] [place], [place] [address] [phone]

## 2018-03-21 ENCOUNTER — Ambulatory Visit: Payer: Self-pay | Admitting: Family Medicine

## 2019-01-15 ENCOUNTER — Emergency Department (HOSPITAL_COMMUNITY): Payer: Self-pay

## 2019-01-15 ENCOUNTER — Emergency Department (HOSPITAL_COMMUNITY)
Admission: EM | Admit: 2019-01-15 | Discharge: 2019-01-15 | Disposition: A | Discharge status: Home / Self Care | Payer: Self-pay | Attending: Emergency Medicine | Admitting: Emergency Medicine

## 2019-01-15 ENCOUNTER — Encounter (HOSPITAL_COMMUNITY): Payer: Self-pay

## 2019-01-15 ENCOUNTER — Other Ambulatory Visit: Payer: Self-pay

## 2019-01-15 DIAGNOSIS — Y998 Other external cause status: Secondary | ICD-10-CM | POA: Insufficient documentation

## 2019-01-15 DIAGNOSIS — I10 Essential (primary) hypertension: Secondary | ICD-10-CM | POA: Insufficient documentation

## 2019-01-15 DIAGNOSIS — Z87891 Personal history of nicotine dependence: Secondary | ICD-10-CM | POA: Insufficient documentation

## 2019-01-15 DIAGNOSIS — Y9389 Activity, other specified: Secondary | ICD-10-CM | POA: Insufficient documentation

## 2019-01-15 DIAGNOSIS — R2231 Localized swelling, mass and lump, right upper limb: Secondary | ICD-10-CM | POA: Insufficient documentation

## 2019-01-15 DIAGNOSIS — F1210 Cannabis abuse, uncomplicated: Secondary | ICD-10-CM | POA: Insufficient documentation

## 2019-01-15 DIAGNOSIS — Y929 Unspecified place or not applicable: Secondary | ICD-10-CM | POA: Insufficient documentation

## 2019-01-15 DIAGNOSIS — S6991XA Unspecified injury of right wrist, hand and finger(s), initial encounter: Secondary | ICD-10-CM | POA: Insufficient documentation

## 2019-01-15 MED ORDER — NAPROXEN 500 MG PO TABS: 500.0000 mg | ORAL_TABLET | Freq: Two times a day (BID) | ORAL | 0 refills | Status: DC
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
## 2019-01-15 NOTE — Discharge Instructions (Signed)
Please read and follow all provided instructions.  You have been seen today for right hand injury.   Tests performed today include: An x-ray of the affected area - does NOT show any broken bones or dislocations.  Vital signs. See below for your results today.   Home care instructions: -- *PRICE in the first 24-48 hours after injury: Protect (with brace, splint, sling), if given by your provider Rest Ice- Do not apply ice pack directly to your skin, place towel or similar between your skin and ice/ice pack. Apply ice for 20 min, then remove for 40 min while awake Compression- Wear brace, elastic bandage, splint as directed by your provider Elevate affected extremity above the level of your heart when not walking around for the first 24-48 hours   Medications:  - Naproxen is a nonsteroidal anti-inflammatory medication that will help with pain and swelling. Be sure to take this medication as prescribed with food, 1 pill every 12 hours,  It should be taken with food, as it can cause stomach upset, and more seriously, stomach bleeding. Do not take other nonsteroidal anti-inflammatory medications with this such as Advil, Motrin, Aleve, Mobic, Goodie Powder, or Motrin.    You make take Tylenol per over the counter dosing with these medications.   We have prescribed you new medication(s) today. Discuss the medications prescribed today with your pharmacist as they can have adverse effects and interactions with your other medicines including over the counter and prescribed medications. Seek medical evaluation if you start to experience new or abnormal symptoms after taking one of these medicines, seek care immediately if you start to experience difficulty breathing, feeling of your throat closing, facial swelling, or rash as these could be indications of a more serious allergic reaction   Follow-up instructions: Please follow-up with your primary care provider or the provided orthopedic physician (bone  specialist) if you continue to have significant pain in 1 week. In this case you may have a more severe injury that requires further care.   Return instructions:  Please return if your digits or extremity are numb or tingling, appear gray or blue, or you have severe pain (also elevate the extremity and loosen splint or wrap if you were given one) Please return if you have redness or fevers.  Please return to the Emergency Department if you experience worsening symptoms.  Please return if you have any other emergent concerns. Additional Information:  Your vital signs today were: BP (!) 155/95 (BP Location: Left Arm)    Pulse 84    Temp 98.4 F (36.9 C) (Oral)    Resp 18    Ht 6' (1.829 m)    Wt 129.3 kg    SpO2 95%    BMI 38.65 kg/m  If your blood pressure (BP) was elevated above 135/85 this visit, please have this repeated by your doctor within one month. ---------------
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
## 2021-02-10 DIAGNOSIS — Z122 Encounter for screening for malignant neoplasm of respiratory organs: Secondary | ICD-10-CM | POA: Diagnosis not present

## 2021-02-10 DIAGNOSIS — L989 Disorder of the skin and subcutaneous tissue, unspecified: Secondary | ICD-10-CM | POA: Diagnosis not present

## 2021-02-10 DIAGNOSIS — Z7689 Persons encountering health services in other specified circumstances: Secondary | ICD-10-CM | POA: Diagnosis not present

## 2021-02-18 DIAGNOSIS — L989 Disorder of the skin and subcutaneous tissue, unspecified: Secondary | ICD-10-CM | POA: Diagnosis not present

## 2021-03-03 DIAGNOSIS — R220 Localized swelling, mass and lump, head: Secondary | ICD-10-CM | POA: Diagnosis not present

## 2021-03-03 DIAGNOSIS — D170 Benign lipomatous neoplasm of skin and subcutaneous tissue of head, face and neck: Secondary | ICD-10-CM | POA: Diagnosis not present

## 2021-04-08 DIAGNOSIS — G4733 Obstructive sleep apnea (adult) (pediatric): Secondary | ICD-10-CM | POA: Diagnosis not present

## 2021-04-08 DIAGNOSIS — Z13220 Encounter for screening for lipoid disorders: Secondary | ICD-10-CM | POA: Diagnosis not present

## 2021-04-08 DIAGNOSIS — Z1329 Encounter for screening for other suspected endocrine disorder: Secondary | ICD-10-CM | POA: Diagnosis not present

## 2021-04-08 DIAGNOSIS — R8290 Unspecified abnormal findings in urine: Secondary | ICD-10-CM | POA: Diagnosis not present

## 2021-04-08 DIAGNOSIS — Z0000 Encounter for general adult medical examination without abnormal findings: Secondary | ICD-10-CM | POA: Diagnosis not present

## 2021-06-23 DIAGNOSIS — G4733 Obstructive sleep apnea (adult) (pediatric): Secondary | ICD-10-CM | POA: Diagnosis not present

## 2021-06-23 DIAGNOSIS — Z139 Encounter for screening, unspecified: Secondary | ICD-10-CM | POA: Diagnosis not present

## 2021-06-23 DIAGNOSIS — G4730 Sleep apnea, unspecified: Secondary | ICD-10-CM | POA: Diagnosis not present

## 2021-06-23 DIAGNOSIS — Z122 Encounter for screening for malignant neoplasm of respiratory organs: Secondary | ICD-10-CM | POA: Diagnosis not present

## 2021-07-23 ENCOUNTER — Emergency Department (HOSPITAL_COMMUNITY): Payer: BC Managed Care – PPO

## 2021-07-23 ENCOUNTER — Observation Stay (HOSPITAL_COMMUNITY): Payer: BC Managed Care – PPO

## 2021-07-23 ENCOUNTER — Encounter (HOSPITAL_COMMUNITY): Payer: Self-pay

## 2021-07-23 ENCOUNTER — Inpatient Hospital Stay (HOSPITAL_COMMUNITY)
Admission: EM | Admit: 2021-07-23 | Discharge: 2021-07-27 | DRG: 378 | Disposition: A | Discharge status: Home / Self Care | Payer: BC Managed Care – PPO | Attending: Internal Medicine | Admitting: Internal Medicine

## 2021-07-23 DIAGNOSIS — I248 Other forms of acute ischemic heart disease: Secondary | ICD-10-CM | POA: Diagnosis present

## 2021-07-23 DIAGNOSIS — Z87891 Personal history of nicotine dependence: Secondary | ICD-10-CM

## 2021-07-23 DIAGNOSIS — R55 Syncope and collapse: Secondary | ICD-10-CM | POA: Diagnosis present

## 2021-07-23 DIAGNOSIS — K921 Melena: Secondary | ICD-10-CM

## 2021-07-23 DIAGNOSIS — D72829 Elevated white blood cell count, unspecified: Secondary | ICD-10-CM | POA: Diagnosis not present

## 2021-07-23 DIAGNOSIS — E538 Deficiency of other specified B group vitamins: Secondary | ICD-10-CM | POA: Diagnosis present

## 2021-07-23 DIAGNOSIS — R61 Generalized hyperhidrosis: Secondary | ICD-10-CM | POA: Diagnosis not present

## 2021-07-23 DIAGNOSIS — K5731 Diverticulosis of large intestine without perforation or abscess with bleeding: Secondary | ICD-10-CM | POA: Diagnosis not present

## 2021-07-23 DIAGNOSIS — K922 Gastrointestinal hemorrhage, unspecified: Secondary | ICD-10-CM | POA: Diagnosis present

## 2021-07-23 DIAGNOSIS — R778 Other specified abnormalities of plasma proteins: Secondary | ICD-10-CM | POA: Diagnosis present

## 2021-07-23 DIAGNOSIS — D72828 Other elevated white blood cell count: Secondary | ICD-10-CM | POA: Diagnosis not present

## 2021-07-23 DIAGNOSIS — D62 Acute posthemorrhagic anemia: Secondary | ICD-10-CM | POA: Diagnosis not present

## 2021-07-23 DIAGNOSIS — Z20822 Contact with and (suspected) exposure to covid-19: Secondary | ICD-10-CM | POA: Diagnosis not present

## 2021-07-23 DIAGNOSIS — D638 Anemia in other chronic diseases classified elsewhere: Secondary | ICD-10-CM | POA: Diagnosis present

## 2021-07-23 DIAGNOSIS — I959 Hypotension, unspecified: Secondary | ICD-10-CM | POA: Diagnosis not present

## 2021-07-23 DIAGNOSIS — N281 Cyst of kidney, acquired: Secondary | ICD-10-CM | POA: Diagnosis not present

## 2021-07-23 DIAGNOSIS — D509 Iron deficiency anemia, unspecified: Secondary | ICD-10-CM | POA: Diagnosis not present

## 2021-07-23 DIAGNOSIS — I10 Essential (primary) hypertension: Secondary | ICD-10-CM | POA: Diagnosis not present

## 2021-07-23 DIAGNOSIS — I459 Conduction disorder, unspecified: Secondary | ICD-10-CM | POA: Diagnosis present

## 2021-07-23 DIAGNOSIS — K760 Fatty (change of) liver, not elsewhere classified: Secondary | ICD-10-CM | POA: Diagnosis not present

## 2021-07-23 DIAGNOSIS — K625 Hemorrhage of anus and rectum: Secondary | ICD-10-CM | POA: Diagnosis not present

## 2021-07-23 DIAGNOSIS — R7989 Other specified abnormal findings of blood chemistry: Secondary | ICD-10-CM | POA: Diagnosis present

## 2021-07-23 DIAGNOSIS — K640 First degree hemorrhoids: Secondary | ICD-10-CM | POA: Diagnosis not present

## 2021-07-23 DIAGNOSIS — R739 Hyperglycemia, unspecified: Secondary | ICD-10-CM | POA: Diagnosis present

## 2021-07-23 DIAGNOSIS — Z800 Family history of malignant neoplasm of digestive organs: Secondary | ICD-10-CM

## 2021-07-23 DIAGNOSIS — K5791 Diverticulosis of intestine, part unspecified, without perforation or abscess with bleeding: Secondary | ICD-10-CM | POA: Diagnosis not present

## 2021-07-23 DIAGNOSIS — K5730 Diverticulosis of large intestine without perforation or abscess without bleeding: Secondary | ICD-10-CM | POA: Diagnosis not present

## 2021-07-23 LAB — PROTIME-INR
INR: 1.1 (ref 0.8–1.2)
Prothrombin Time: 13.9 seconds (ref 11.4–15.2)

## 2021-07-23 LAB — HEMOGLOBIN AND HEMATOCRIT, BLOOD
HCT: 32 % — ABNORMAL LOW (ref 39.0–52.0)
HCT: 33.3 % — ABNORMAL LOW (ref 39.0–52.0)
Hemoglobin: 10.6 g/dL — ABNORMAL LOW (ref 13.0–17.0)
Hemoglobin: 10.9 g/dL — ABNORMAL LOW (ref 13.0–17.0)

## 2021-07-23 LAB — HIV ANTIBODY (ROUTINE TESTING W REFLEX): HIV Screen 4th Generation wRfx: NONREACTIVE

## 2021-07-23 LAB — COMPREHENSIVE METABOLIC PANEL
ALT: 21 U/L (ref 0–44)
AST: 18 U/L (ref 15–41)
Albumin: 3.4 g/dL — ABNORMAL LOW (ref 3.5–5.0)
Alkaline Phosphatase: 41 U/L (ref 38–126)
Anion gap: 6 (ref 5–15)
BUN: 24 mg/dL — ABNORMAL HIGH (ref 8–23)
CO2: 23 mmol/L (ref 22–32)
Calcium: 7.9 mg/dL — ABNORMAL LOW (ref 8.9–10.3)
Chloride: 110 mmol/L (ref 98–111)
Creatinine, Ser: 1.04 mg/dL (ref 0.61–1.24)
GFR, Estimated: >60 mL/min (ref >60)
Glucose, Bld: 173 mg/dL — ABNORMAL HIGH (ref 70–99)
Potassium: 4 mmol/L (ref 3.5–5.1)
Sodium: 139 mmol/L (ref 135–145)
Total Bilirubin: 0.2 mg/dL — ABNORMAL LOW (ref 0.3–1.2)
Total Protein: 6.4 g/dL — ABNORMAL LOW (ref 6.5–8.1)

## 2021-07-23 LAB — CBC
HCT: 35 % — ABNORMAL LOW (ref 39.0–52.0)
Hemoglobin: 11.5 g/dL — ABNORMAL LOW (ref 13.0–17.0)
MCH: 29.3 pg (ref 26.0–34.0)
MCHC: 32.9 g/dL (ref 30.0–36.0)
MCV: 89.3 fL (ref 80.0–100.0)
Platelets: 248 10*3/uL (ref 150–400)
RBC: 3.92 MIL/uL — ABNORMAL LOW (ref 4.22–5.81)
RDW: 12.9 % (ref 11.5–15.5)
WBC: 12.6 10*3/uL — ABNORMAL HIGH (ref 4.0–10.5)
nRBC: 0 % (ref 0.0–0.2)

## 2021-07-23 LAB — RESP PANEL BY RT-PCR (FLU A&B, COVID) ARPGX2
Influenza A by PCR: NEGATIVE
Influenza B by PCR: NEGATIVE
SARS Coronavirus 2 by RT PCR: NEGATIVE

## 2021-07-23 LAB — ABO/RH: ABO/RH(D): B POS

## 2021-07-23 LAB — TROPONIN I (HIGH SENSITIVITY)
Troponin I (High Sensitivity): 10 ng/L (ref <18)
Troponin I (High Sensitivity): 26 ng/L — ABNORMAL HIGH (ref <18)
Troponin I (High Sensitivity): 27 ng/L — ABNORMAL HIGH (ref <18)
Troponin I (High Sensitivity): 23 ng/L — ABNORMAL HIGH (ref <18)

## 2021-07-23 MED ORDER — IOHEXOL 350 MG/ML SOLN
100.0000 mL | Freq: Once | INTRAVENOUS | Status: AC | PRN
  Administered 2021-07-23: 100 mL via INTRAVENOUS

## 2021-07-23 MED ORDER — DEXTROSE-NACL 5-0.45 % IV SOLN: INTRAVENOUS | Status: DC

## 2021-07-23 MED ORDER — SODIUM CHLORIDE 0.9 % IV BOLUS
1000.0000 mL | Freq: Once | INTRAVENOUS | Status: AC
  Administered 2021-07-23: 1000 mL via INTRAVENOUS

## 2021-07-23 MED ORDER — METHOCARBAMOL 1000 MG/10ML IJ SOLN
500.0000 mg | Freq: Once | INTRAVENOUS | Status: AC
  Administered 2021-07-23: 500 mg via INTRAVENOUS
  Filled 2021-07-23: qty 500

## 2021-07-23 MED ORDER — SODIUM CHLORIDE (PF) 0.9 % IJ SOLN
INTRAMUSCULAR | Status: AC
  Filled 2021-07-23: qty 50

## 2021-07-23 MED ORDER — PANTOPRAZOLE SODIUM 40 MG IV SOLR
40.0000 mg | Freq: Two times a day (BID) | INTRAVENOUS | Status: DC
  Administered 2021-07-23 – 2021-07-27 (×9): 40 mg via INTRAVENOUS
  Filled 2021-07-23 (×9): qty 40

## 2021-07-23 MED ORDER — SODIUM CHLORIDE 0.9% FLUSH
3.0000 mL | Freq: Two times a day (BID) | INTRAVENOUS | Status: DC
  Administered 2021-07-23 – 2021-07-27 (×8): 3 mL via INTRAVENOUS

## 2021-07-23 NOTE — ED Notes (Signed)
Water and gingerale provided.

## 2021-07-23 NOTE — H&P (Signed)
History and Physical    Todd Osborn YSA:[id number] DOB: May 31, 1961 DOA: 07/23/2021  PCP: Azzie Glatter, FNP  Patient coming from: Home  Chief Complaint: rectal bleeding  HPI: Todd Osborn is a 61 y.o. male with medical history significant of HTN. Presenting with rectal bleeding. He was in his normal state of health until this morning. When he woke, he went to the bathroom and had a bloody BM. There was no abdominal or rectal pain. He dismissed it and went to work. When he got to work, he had another bloody BM. He decided to go home and rest. When he got back home, he had a third bloody BM. This time, he began to feel lightheaded and dizzy. His family called for EMS. Upon arrival, the patient passed out and had a 4th bloody BM. He came to and was brought to the ED for evaluation. He denies any anticoagulation or regular NSAID use. He denies any personal cancer history, but does report colon cancer in his mother and father. His last c-scope was in 2019 per his recollection.  He denies any other aggravating or alleviating factors.   ED Course: Hgb was 11.5. He was heme positive. [facility] was consulted. [facility] was called for admission.   Review of Systems:  Denies CP, palpitations, abdominal pain, rectal pain, N/V/fevers, sick contacts. Review of systems is otherwise negative for all not mentioned in HPI.   PMHx Past Medical History:  Diagnosis Date   Colon abnormality Positive COLOGUARD **POSITIVE** 03/16/2017   History of cellulitis    Hypertension     PSHx History reviewed. No pertinent surgical history.  SocHx  reports that he quit smoking about 35 years ago. His smoking use included cigarettes. He has never used smokeless tobacco. He reports that he does not drink alcohol and does not use drugs.  Allergies  Allergen Reactions   Amlodipine     Nosebleed    Losartan     Nosebleed    Penicillins Hives    Has patient had a PCN reaction causing immediate rash, facial/tongue/throat  swelling, SOB or lightheadedness with hypotension: No Has patient had a PCN reaction causing severe rash involving mucus membranes or skin necrosis: No Has patient had a PCN reaction that required hospitalization: No Has patient had a PCN reaction occurring within the last 10 years: No If all of the above answers are "NO", then may proceed with Cephalosporin use. Pt tolerated Keflex in April 2018    FamHx Family History  Problem Relation Age of Onset   Cancer Mother    Colon cancer Mother 7   Cancer Father    Colon cancer Father 58   Esophageal cancer Neg Hx    Rectal cancer Neg Hx    Stomach cancer Neg Hx     Prior to Admission medications   Medication Sig Start Date End Date Taking? Authorizing Provider  ibuprofen (ADVIL,MOTRIN) 200 MG tablet Take 200 mg by mouth as needed.   Yes [provider]  nabumetone (RELAFEN) 500 MG tablet Take 1 tablet (500 mg total) by mouth 2 (two) times daily. Patient not taking: Reported on 12/19/2017 10/12/17   Sheard, Myeong O, DPM  naproxen (NAPROSYN) 500 MG tablet Take 1 tablet (500 mg total) by mouth 2 (two) times daily. Patient not taking: Reported on 07/23/2021 01/15/19   Amaryllis Dyke, PA-C    Physical Exam: Vitals:   07/23/21 1030 07/23/21 1100 07/23/21 1130 07/23/21 1200  BP: 123/68 136/65 118/63 123/70  Pulse:  75 61 62 75  Resp: 16 19 20 18   Temp:      TempSrc:      SpO2: 99% 96% 98% 99%  Weight:      Height:        General: 61 y.o. male resting in bed in NAD Eyes: PERRL, normal sclera ENMT: Nares patent w/o discharge, orophaynx clear, dentition normal, ears w/o discharge/lesions/ulcers Neck: Supple, trachea midline Cardiovascular: RRR, +S1, S2, no m/g/r, equal pulses throughout Respiratory: CTABL, no w/r/r, normal WOB GI: BS+, NDNT, no masses noted, no organomegaly noted MSK: No e/c/c Neuro: A&O x 3, no focal deficits Psyc: Appropriate interaction and affect, calm/cooperative  Labs on Admission: I have  personally reviewed following labs and imaging studies  CBC: Recent Labs  Lab 07/23/21 1012  WBC 12.6*  HGB 11.5*  HCT 35.0*  MCV 89.3  PLT 063   Basic Metabolic Panel: Recent Labs  Lab 07/23/21 1012  NA 139  K 4.0  CL 110  CO2 23  GLUCOSE 173*  BUN 24*  CREATININE 1.04  CALCIUM 7.9*   GFR: Estimated Creatinine Clearance: 103.7 mL/min (by C-G formula based on SCr of 1.04 mg/dL). Liver Function Tests: Recent Labs  Lab 07/23/21 1012  AST 18  ALT 21  ALKPHOS 41  BILITOT 0.2*  PROT 6.4*  ALBUMIN 3.4*   No results for input(s): LIPASE, AMYLASE in the last 168 hours. No results for input(s): AMMONIA in the last 168 hours. Coagulation Profile: No results for input(s): INR, PROTIME in the last 168 hours. Cardiac Enzymes: No results for input(s): CKTOTAL, CKMB, CKMBINDEX, TROPONINI in the last 168 hours. BNP (last 3 results) No results for input(s): PROBNP in the last 8760 hours. HbA1C: No results for input(s): HGBA1C in the last 72 hours. CBG: No results for input(s): GLUCAP in the last 168 hours. Lipid Profile: No results for input(s): CHOL, HDL, LDLCALC, TRIG, CHOLHDL, LDLDIRECT in the last 72 hours. Thyroid Function Tests: No results for input(s): TSH, T4TOTAL, FREET4, T3FREE, THYROIDAB in the last 72 hours. Anemia Panel: No results for input(s): VITAMINB12, FOLATE, FERRITIN, TIBC, IRON, RETICCTPCT in the last 72 hours. Urine analysis:    Component Value Date/Time   COLORURINE YELLOW 10/10/2016 1604   APPEARANCEUR HAZY (A) 10/10/2016 1604   LABSPEC 1.025 06/19/2017 1345   PHURINE 5.5 06/19/2017 1345   GLUCOSEU NEGATIVE 06/19/2017 1345   HGBUR NEGATIVE 06/19/2017 1345   BILIRUBINUR negative 12/19/2017 1352   KETONESUR negative 12/19/2017 1352   KETONESUR NEGATIVE 06/19/2017 1345   PROTEINUR trace (A) 12/19/2017 1352   PROTEINUR NEGATIVE 06/19/2017 1345   UROBILINOGEN 0.2 12/19/2017 1352   UROBILINOGEN 0.2 06/19/2017 1345   NITRITE Negative 12/19/2017  1352   NITRITE NEGATIVE 06/19/2017 1345   LEUKOCYTESUR Negative 12/19/2017 1352    Radiological Exams on Admission: DG Chest Port 1 View  Result Date: 07/23/2021 CLINICAL DATA:  Syncope and rectal bleeding. EXAM: PORTABLE CHEST 1 VIEW COMPARISON:  Chest x-ray dated June 23, 2021. FINDINGS: The heart size and mediastinal contours are within normal limits. Both lungs are clear. The visualized skeletal structures are unremarkable. IMPRESSION: No active disease. Electronically Signed   By: Titus Dubin M.D.   On: 07/23/2021 12:18    EKG: None obtained in the ED  Assessment/Plan GIB Symptomatic anemia     - place in obs, tele     - Hgb is ok right now, but with the given number of bloody stools, fully expect a several point drop as things settle out     -  for now, fluids, transfuse if he drops < 7 or become severely symptomatic     - follow q6h H&H     - protonix     - [facility] onboard, appreciate assistance  Syncope     - likely related to blood loss; however we will check echo and orthostatics  Hyperglycemia     - no history of DM     - check A1c  Elevated troponin     - mild elevation (10 -> 26), no chest pain, check EKG     - trend trp     - echo for syncope  DVT prophylaxis: SCDs  Code Status: FULL  Family Communication: w/ family at bedside  Consults called: [facility]   Status is: Observation  The patient remains OBS appropriate and will d/c before 2 midnights.  Time spent coordinating admission: 55 minutes  [facility]  If 7PM-7AM, please contact night-coverage [email or web address]  07/23/2021, 12:30 PM

## 2021-07-23 NOTE — ED Provider Notes (Signed)
[facility] DEPT Provider Note   CSN: [id number] Arrival date & time: 07/23/21  0946     History  Chief Complaint  Patient presents with   GI Bleeding    Todd Osborn is a 61 y.o. male.  HPI Patient presents via EMS with concern for rectal bleeding and syncope.  Patient is awake, alert, providing history, details also advised by his sister who arrives to contribute, EMS notes as well. He was generally well until this morning, when he had urge to defecate.  He had 4 episodes of bright red blood per rectum with trivial amounts of stool.  He has no abdominal pain.  The fourth episode occurred and subsequently he felt lightheaded, lost consciousness.  This last event was witnessed by fire department personnel.  Per report EMS had last vital signs 126/70, unremarkable heart rate.  On monitor the patient had no notable arrhythmia.  Currently patient denies pain, denies lightheadedness, but he is resting supine. Notable history for colonoscopy in the distant past, positive Cologuard test about 3 years ago, seemingly no follow-up.  Patient's wife died 3 months ago, complicating his efforts for ongoing outpatient care.    Home Medications Prior to Admission medications   Medication Sig Start Date End Date Taking? Authorizing Provider  ibuprofen (ADVIL,MOTRIN) 200 MG tablet Take 200 mg by mouth as needed.   Yes [provider]  nabumetone (RELAFEN) 500 MG tablet Take 1 tablet (500 mg total) by mouth 2 (two) times daily. Patient not taking: Reported on 12/19/2017 10/12/17   Sheard, Myeong O, DPM  naproxen (NAPROSYN) 500 MG tablet Take 1 tablet (500 mg total) by mouth 2 (two) times daily. Patient not taking: Reported on 07/23/2021 01/15/19   Petrucelli, Aldona Bar R, PA-C      Allergies    Amlodipine, Losartan, and Penicillins    Review of Systems   Review of Systems  Constitutional:        Per HPI, otherwise negative  HENT:         Per HPI, otherwise  negative  Respiratory:         Per HPI, otherwise negative  Cardiovascular:        Per HPI, otherwise negative  Gastrointestinal:  Positive for blood in stool. Negative for vomiting.  Endocrine:       Negative aside from HPI  Genitourinary:        Neg aside from HPI   Musculoskeletal:        Per HPI, otherwise negative  Skin: Negative.   Neurological:  Negative for syncope.   Physical Exam Updated Vital Signs BP 123/70    Pulse 75    Temp 97.8 F (36.6 C) (Oral)    Resp 18    Ht 6' (1.829 m)    Wt 129.3 kg    SpO2 99%    BMI 38.65 kg/m  Physical Exam Vitals and nursing note reviewed.  Constitutional:      General: He is not in acute distress.    Appearance: He is well-developed.  HENT:     Head: Normocephalic and atraumatic.  Eyes:     Conjunctiva/sclera: Conjunctivae normal.  Cardiovascular:     Rate and Rhythm: Normal rate and regular rhythm.  Pulmonary:     Effort: Pulmonary effort is normal. No respiratory distress.     Breath sounds: No stridor.  Abdominal:     General: There is no distension.     Tenderness: There is no abdominal tenderness. There is  no guarding.  Skin:    General: Skin is warm and dry.  Neurological:     Mental Status: He is alert and oriented to person, place, and time.   Photographic evidence of bright red blood per rectum in the commode reviewed ED Results / Procedures / Treatments   Labs (all labs ordered are listed, but only abnormal results are displayed) Labs Reviewed  COMPREHENSIVE METABOLIC PANEL - Abnormal; Notable for the following components:      Result Value   Glucose, Bld 173 (*)    BUN 24 (*)    Calcium 7.9 (*)    Total Protein 6.4 (*)    Albumin 3.4 (*)    Total Bilirubin 0.2 (*)    All other components within normal limits  CBC - Abnormal; Notable for the following components:   WBC 12.6 (*)    RBC 3.92 (*)    Hemoglobin 11.5 (*)    HCT 35.0 (*)    All other components within normal limits  RESP PANEL BY RT-PCR  (FLU A&B, COVID) ARPGX2  TYPE AND SCREEN  ABO/RH  TROPONIN I (HIGH SENSITIVITY)  TROPONIN I (HIGH SENSITIVITY)    EKG Sinus, 71, left bundle branch block.  Artifact, nonspecific ST wave changes abnormal   Radiology DG Chest Port 1 View  Result Date: 07/23/2021 CLINICAL DATA:  Syncope and rectal bleeding. EXAM: PORTABLE CHEST 1 VIEW COMPARISON:  Chest x-ray dated June 23, 2021. FINDINGS: The heart size and mediastinal contours are within normal limits. Both lungs are clear. The visualized skeletal structures are unremarkable. IMPRESSION: No active disease. Electronically Signed   By: Titus Dubin M.D.   On: 07/23/2021 12:18    Procedures Procedures    Medications Ordered in ED Medications  sodium chloride 0.9 % bolus 1,000 mL (0 mLs Intravenous Stopped 07/23/21 1135)    ED Course/ Medical Decision Making/ A&P Differential including acute GI bleed, diverticulosis, diverticulitis, contributing to the events as well as consideration of cardiac strain given syncope versus dysrhythmia versus hypotension versus exsanguination.  Patient placed on continuous cardiac monitor, pulse oximetry.  Cardiac sinus 60s unremarkable Pulse ox 100% room air normal EMS rhythm strip reviewed, sinus rhythm, left bundle branch block, rate 64, abnormal  11:57 AM Patient and sister aware of all findings, I discussed the case with our lab our gastroenterology colleagues for consultation.   Chart review notable for ongoing primary care visit with [facility], and local pulmonology for concern for sleep apnea with ongoing titration of his BiPAP. 12:25 PM Remaining labs reviewed, including COVID, influenza, both negative, x-ray reviewed, no notable findings, EKG nonischemic.  After additional conversation with our internal medicine team, patient admitted for monitoring, management for syncope, ongoing rectal bleeding.  CRITICAL CARE Performed by: Carmin Muskrat Total critical care time: 35  minutes Critical care time was exclusive of separately billable procedures and treating other patients. Critical care was necessary to treat or prevent imminent or life-threatening deterioration. Critical care was time spent personally by me on the following activities: development of treatment plan with patient and/or surrogate as well as nursing, discussions with consultants, evaluation of patient's response to treatment, examination of patient, obtaining history from patient or surrogate, ordering and performing treatments and interventions, ordering and review of laboratory studies, ordering and review of radiographic studies, pulse oximetry and re-evaluation of patient's condition.   Final Clinical Impression(s) / ED Diagnoses Final diagnoses:  Acute GI bleeding  Syncope and collapse     Carmin Muskrat, MD 07/23/21  1226 ° °

## 2021-07-23 NOTE — ED Triage Notes (Signed)
Pt arrives via [facility] c/o new onset dark red blood from rectum since this morning. Pt had syncopal episode with fire dept.   #18 L hand with 500 mL NS BOLUS  EMS last VS - 126/70, HR 60, CBG 217, 98% on RA

## 2021-07-23 NOTE — Consult Note (Addendum)
Consultation  Referring Provider:  TR[facility]/ Marylyn Ishihara DO Primary Care Physician:  Azzie Glatter, FNP Primary Gastroenterologist:  Dr.Danis  Reason for Consultation: Acute GI bleed   Attending physician's note  I have taken a history, reviewed the chart and examined the patient. I performed a substantive portion of this encounter, including complete performance of at least one of the key components, in conjunction with the APP. I agree with the APP's note, impression and recommendations.     61 year old gentleman presented with acute recurrent painless hematochezia concerning for acute diverticular hemorrhage  Reviewed last colonoscopy in 2018 by Dr. Simona Huh, has extensive left-sided diverticular disease  He had an episode of hematochezia in the ER, underwent CT angio which was negative for any active extravasation.  Monitor H&H and transfuse as needed If he develops acute episode of recurrent hematochezia, please order stat RBC tagged scan to localize the site of bleeding and consult IR for embolization  Dr. Benson Norway will be covering for  GI this weekend, he will round on this patient  The patient was provided an opportunity to ask questions and all were answered. The patient agreed with the plan and demonstrated an understanding of the instructions.  Damaris Hippo , MD [phone]     HPI: Todd Osborn is a 61 y.o. male, generally in good health, who was seen by Dr. Loletha Carrow in 2018 for colonoscopy done for positive Cologuard.  He was found to have 4 polyps, largest 4 to 6 mm, multiple diverticuli throughout the colon and grade 1 internal hemorrhoids.  Path showed to the polyps to be tubular adenomas and 2 were hyperplastic. Patient had acute onset this morning about 6 AM with urge for bowel movement followed by a large volume of bright red blood.  He had another episode about 8 AM after he had gone to work.  Because of the bleeding he headed back home and then on 10 AM started  feeling dizzy and lightheaded, family called EMS and when they arrived he had a syncopal episode but did not fall, was helped to the floor.  This was followed by another episode of large volume of hematochezia. Patient has had 1 episode since arrival in the emergency room that occurred about 20 minutes ago about the same volume.  He has not had any prior episodes of GI bleeding, he is not anticoagulated, does take occasional NSAIDs though not daily. He has been hemodynamically stable here with systolic blood pressure in the 120s.  He is not had any associated abdominal pain, no nausea or vomiting, no chest pain or shortness of breath.  Baseline hemoglobin of 13.8 in October 2022 Labs here WBC 12.6 hemoglobin 11.5/hematocrit of 35 BUN 24/creatinine 1.04 LFTs within normal limits Troponin negative x1 Respiratory panel negative Chest x-ray negative.   Past Medical History:  Diagnosis Date   Colon abnormality Positive COLOGUARD **POSITIVE** 03/16/2017   History of cellulitis    Hypertension     History reviewed. No pertinent surgical history.  Prior to Admission medications   Medication Sig Start Date End Date Taking? Authorizing Provider  ibuprofen (ADVIL,MOTRIN) 200 MG tablet Take 200 mg by mouth as needed.   Yes [provider]  nabumetone (RELAFEN) 500 MG tablet Take 1 tablet (500 mg total) by mouth 2 (two) times daily. Patient not taking: Reported on 12/19/2017 10/12/17   Sheard, Myeong O, DPM  naproxen (NAPROSYN) 500 MG tablet Take 1 tablet (500 mg total) by mouth 2 (two) times daily. Patient  not taking: Reported on 07/23/2021 01/15/19   Petrucelli, Aldona Bar R, PA-C    No current facility-administered medications for this encounter.   Current Outpatient Medications  Medication Sig Dispense Refill   ibuprofen (ADVIL,MOTRIN) 200 MG tablet Take 200 mg by mouth as needed.     nabumetone (RELAFEN) 500 MG tablet Take 1 tablet (500 mg total) by mouth 2 (two) times daily. (Patient  not taking: Reported on 12/19/2017) 60 tablet 1   naproxen (NAPROSYN) 500 MG tablet Take 1 tablet (500 mg total) by mouth 2 (two) times daily. (Patient not taking: Reported on 07/23/2021) 10 tablet 0    Allergies as of 07/23/2021 - Review Complete 07/23/2021  Allergen Reaction Noted   Amlodipine  06/25/2017   Losartan  06/25/2017   Penicillins Hives 10/10/2016    Family History  Problem Relation Age of Onset   Cancer Mother    Colon cancer Mother 30   Cancer Father    Colon cancer Father 58   Esophageal cancer Neg Hx    Rectal cancer Neg Hx    Stomach cancer Neg Hx     Social History   Socioeconomic History   Marital status: Married    Spouse name: Not on file   Number of children: Not on file   Years of education: Not on file   Highest education level: Not on file  Occupational History   Not on file  Tobacco Use   Smoking status: Former    Types: Cigarettes    Quit date: 07/04/1986    Years since quitting: 35.0   Smokeless tobacco: Never  Vaping Use   Vaping Use: Never used  Substance and Sexual Activity   Alcohol use: No    Comment: occasional   Drug use: No    Frequency: 4.0 times per week    Types: Marijuana    Comment: occasional   Sexual activity: Not on file  Other Topics Concern   Not on file  Social History Narrative   Not on file   Social Determinants of Health   Financial Resource Strain: Not on file  Food Insecurity: Not on file  Transportation Needs: Not on file  Physical Activity: Not on file  Stress: Not on file  Social Connections: Not on file  Intimate Partner Violence: Not on file    Review of Systems: Pertinent positive and negative review of systems were noted in the above HPI section.  All other review of systems was otherwise negative.  Physical Exam: Vital signs in last 24 hours: Temp:  [97.8 F (36.6 C)] 97.8 F (36.6 C) (01/20 1001) Pulse Rate:  [61-75] 75 (01/20 1200) Resp:  [16-20] 18 (01/20 1200) BP: (118-136)/(63-72)  123/70 (01/20 1200) SpO2:  [96 %-99 %] 99 % (01/20 1200) Weight:  [129.3 kg] 129.3 kg (01/20 0959)   General:   Alert,  Well-developed, well-nourished, older white male pleasant and cooperative in NAD.  Sister at bedside Head:  Normocephalic and atraumatic. Eyes:  Sclera clear, no icterus.   Conjunctiva pink. Ears:  Normal auditory acuity. Nose:  No deformity, discharge,  or lesions. Mouth:  No deformity or lesions.   Neck:  Supple; no masses or thyromegaly. Lungs:  Clear throughout to auscultation.   No wheezes, crackles, or rhonchi.  Heart:  Regular rate and rhythm; no murmurs, clicks, rubs,  or gallops. Abdomen:  Soft, obese, nontender, BS active,nonpalp mass or hsm.   Rectal: Not done-reviewed pictures which show commode full of dark red blood Msk:  Symmetrical  without gross deformities. . Pulses:  Normal pulses noted. Extremities:  Without clubbing or edema. Neurologic:  Alert and  oriented x4;  grossly normal neurologically. Skin:  Intact without significant lesions or rashes.. Psych:  Alert and cooperative. Normal mood and affect.  Intake/Output from previous day: No intake/output data recorded. Intake/Output this shift: No intake/output data recorded.  Lab Results: Recent Labs    07/23/21 1012  WBC 12.6*  HGB 11.5*  HCT 35.0*  PLT 248   BMET Recent Labs    07/23/21 1012  NA 139  K 4.0  CL 110  CO2 23  GLUCOSE 173*  BUN 24*  CREATININE 1.04  CALCIUM 7.9*   LFT Recent Labs    07/23/21 1012  PROT 6.4*  ALBUMIN 3.4*  AST 18  ALT 21  ALKPHOS 41  BILITOT 0.2*   PT/INR No results for input(s): LABPROT, INR in the last 72 hours. Hepatitis Panel No results for input(s): HEPBSAG, HCVAB, HEPAIGM, HEPBIGM in the last 72 hours.   IMPRESSION:  #31 61 year old white male with acute GI bleed, consistent with lower GI bleeding very likely secondary to diverticular hemorrhage  #2 mild normocytic anemia secondary to above #3 syncope-secondary to above #4  history of adenomatous and hyperplastic colon polyps-patient will be due for colonoscopy this year #5 grade 1 internal hemorrhoids  PLAN: Bedrest for now up with assist only to bathroom Patient has had a 1000 cc fluid bolus, will start IV fluids at 100 cc/h N.p.o. Schedule for CT angio abdomen and pelvis-if positive for active bleeding will ask IR to proceed with embolization Serial hemoglobins every 6 hours, transfuse for hemoglobin less than 8 GI will follow with you   Amy Esterwood PA-C 07/23/2021, 12:48 PM

## 2021-07-24 ENCOUNTER — Other Ambulatory Visit: Payer: Self-pay

## 2021-07-24 ENCOUNTER — Encounter (HOSPITAL_COMMUNITY): Payer: Self-pay | Admitting: Internal Medicine

## 2021-07-24 ENCOUNTER — Observation Stay (HOSPITAL_COMMUNITY): Payer: BC Managed Care – PPO

## 2021-07-24 DIAGNOSIS — I10 Essential (primary) hypertension: Secondary | ICD-10-CM | POA: Diagnosis present

## 2021-07-24 DIAGNOSIS — D62 Acute posthemorrhagic anemia: Secondary | ICD-10-CM

## 2021-07-24 DIAGNOSIS — Z87891 Personal history of nicotine dependence: Secondary | ICD-10-CM | POA: Diagnosis not present

## 2021-07-24 DIAGNOSIS — K640 First degree hemorrhoids: Secondary | ICD-10-CM | POA: Diagnosis present

## 2021-07-24 DIAGNOSIS — D72829 Elevated white blood cell count, unspecified: Secondary | ICD-10-CM | POA: Diagnosis not present

## 2021-07-24 DIAGNOSIS — D72828 Other elevated white blood cell count: Secondary | ICD-10-CM | POA: Diagnosis present

## 2021-07-24 DIAGNOSIS — R55 Syncope and collapse: Secondary | ICD-10-CM | POA: Diagnosis present

## 2021-07-24 DIAGNOSIS — K921 Melena: Secondary | ICD-10-CM | POA: Diagnosis present

## 2021-07-24 DIAGNOSIS — R778 Other specified abnormalities of plasma proteins: Secondary | ICD-10-CM | POA: Diagnosis not present

## 2021-07-24 DIAGNOSIS — K922 Gastrointestinal hemorrhage, unspecified: Secondary | ICD-10-CM | POA: Diagnosis not present

## 2021-07-24 DIAGNOSIS — Z800 Family history of malignant neoplasm of digestive organs: Secondary | ICD-10-CM | POA: Diagnosis not present

## 2021-07-24 DIAGNOSIS — I459 Conduction disorder, unspecified: Secondary | ICD-10-CM | POA: Diagnosis present

## 2021-07-24 DIAGNOSIS — K5731 Diverticulosis of large intestine without perforation or abscess with bleeding: Secondary | ICD-10-CM | POA: Diagnosis present

## 2021-07-24 DIAGNOSIS — R739 Hyperglycemia, unspecified: Secondary | ICD-10-CM | POA: Diagnosis present

## 2021-07-24 DIAGNOSIS — D638 Anemia in other chronic diseases classified elsewhere: Secondary | ICD-10-CM | POA: Diagnosis present

## 2021-07-24 DIAGNOSIS — I248 Other forms of acute ischemic heart disease: Secondary | ICD-10-CM | POA: Diagnosis present

## 2021-07-24 DIAGNOSIS — E538 Deficiency of other specified B group vitamins: Secondary | ICD-10-CM | POA: Diagnosis present

## 2021-07-24 DIAGNOSIS — Z20822 Contact with and (suspected) exposure to covid-19: Secondary | ICD-10-CM | POA: Diagnosis present

## 2021-07-24 LAB — COMPREHENSIVE METABOLIC PANEL
ALT: 19 U/L (ref 0–44)
AST: 18 U/L (ref 15–41)
Albumin: 3.3 g/dL — ABNORMAL LOW (ref 3.5–5.0)
Alkaline Phosphatase: 36 U/L — ABNORMAL LOW (ref 38–126)
Anion gap: 6 (ref 5–15)
BUN: 15 mg/dL (ref 8–23)
CO2: 25 mmol/L (ref 22–32)
Calcium: 8.1 mg/dL — ABNORMAL LOW (ref 8.9–10.3)
Chloride: 105 mmol/L (ref 98–111)
Creatinine, Ser: 0.86 mg/dL (ref 0.61–1.24)
GFR, Estimated: >60 mL/min (ref >60)
Glucose, Bld: 126 mg/dL — ABNORMAL HIGH (ref 70–99)
Potassium: 3.8 mmol/L (ref 3.5–5.1)
Sodium: 136 mmol/L (ref 135–145)
Total Bilirubin: 0.8 mg/dL (ref 0.3–1.2)
Total Protein: 6.2 g/dL — ABNORMAL LOW (ref 6.5–8.1)

## 2021-07-24 LAB — CBC
HCT: 29.9 % — ABNORMAL LOW (ref 39.0–52.0)
Hemoglobin: 9.8 g/dL — ABNORMAL LOW (ref 13.0–17.0)
MCH: 29 pg (ref 26.0–34.0)
MCHC: 32.8 g/dL (ref 30.0–36.0)
MCV: 88.5 fL (ref 80.0–100.0)
Platelets: 258 10*3/uL (ref 150–400)
RBC: 3.38 MIL/uL — ABNORMAL LOW (ref 4.22–5.81)
RDW: 13 % (ref 11.5–15.5)
WBC: 12.6 10*3/uL — ABNORMAL HIGH (ref 4.0–10.5)
nRBC: 0 % (ref 0.0–0.2)

## 2021-07-24 LAB — ECHOCARDIOGRAM COMPLETE
AR max vel: 2.86 cm2
AV Area VTI: 2.79 cm2
AV Area mean vel: 2.72 cm2
AV Mean grad: 7 mmHg
AV Peak grad: 12.7 mmHg
Ao pk vel: 1.78 m/s
Area-P 1/2: 3.23 cm2
Height: 72 in
S' Lateral: 2.9 cm
Weight: 4560 oz

## 2021-07-24 LAB — PREPARE RBC (CROSSMATCH)

## 2021-07-24 LAB — HEMOGLOBIN AND HEMATOCRIT, BLOOD
HCT: 29.5 % — ABNORMAL LOW (ref 39.0–52.0)
HCT: 24.8 % — ABNORMAL LOW (ref 39.0–52.0)
HCT: 21.2 % — ABNORMAL LOW (ref 39.0–52.0)
Hemoglobin: 9.7 g/dL — ABNORMAL LOW (ref 13.0–17.0)
Hemoglobin: 8.3 g/dL — ABNORMAL LOW (ref 13.0–17.0)
Hemoglobin: 7 g/dL — ABNORMAL LOW (ref 13.0–17.0)

## 2021-07-24 LAB — IRON AND TIBC
Iron: 58 ug/dL (ref 45–182)
Saturation Ratios: 25 % (ref 17.9–39.5)
TIBC: 232 ug/dL — ABNORMAL LOW (ref 250–450)
UIBC: 174 ug/dL

## 2021-07-24 LAB — FERRITIN: Ferritin: 130 ng/mL (ref 24–336)

## 2021-07-24 LAB — MRSA NEXT GEN BY PCR, NASAL: MRSA by PCR Next Gen: NOT DETECTED

## 2021-07-24 LAB — VITAMIN B12: Vitamin B-12: 148 pg/mL — ABNORMAL LOW (ref 180–914)

## 2021-07-24 LAB — FOLATE: Folate: 15.1 ng/mL (ref >5.9)

## 2021-07-24 MED ORDER — CHLORHEXIDINE GLUCONATE CLOTH 2 % EX PADS
6.0000 | MEDICATED_PAD | Freq: Every day | CUTANEOUS | Status: DC
  Administered 2021-07-24 – 2021-07-26 (×4): 6 via TOPICAL

## 2021-07-24 MED ORDER — SODIUM CHLORIDE 0.9 % IV BOLUS
1000.0000 mL | Freq: Once | INTRAVENOUS | Status: AC
  Administered 2021-07-24: 1000 mL via INTRAVENOUS

## 2021-07-24 MED ORDER — DIPHENHYDRAMINE HCL 25 MG PO CAPS
25.0000 mg | ORAL_CAPSULE | Freq: Once | ORAL | Status: AC
  Administered 2021-07-24: 25 mg via ORAL
  Filled 2021-07-24: qty 1

## 2021-07-24 MED ORDER — CYANOCOBALAMIN 1000 MCG/ML IJ SOLN
1000.0000 ug | Freq: Every day | INTRAMUSCULAR | Status: DC
  Administered 2021-07-24 – 2021-07-27 (×4): 1000 ug via SUBCUTANEOUS
  Filled 2021-07-24 (×4): qty 1

## 2021-07-24 MED ORDER — ACETAMINOPHEN 325 MG PO TABS
650.0000 mg | ORAL_TABLET | Freq: Once | ORAL | Status: AC
  Administered 2021-07-24: 650 mg via ORAL
  Filled 2021-07-24: qty 2

## 2021-07-24 MED ORDER — SODIUM CHLORIDE 0.9% IV SOLUTION: Freq: Once | INTRAVENOUS | Status: DC

## 2021-07-24 MED ORDER — SODIUM CHLORIDE 0.9% IV SOLUTION: Freq: Once | INTRAVENOUS | Status: AC

## 2021-07-24 MED ORDER — PERFLUTREN LIPID MICROSPHERE
1.0000 mL | INTRAVENOUS | Status: AC | PRN
  Administered 2021-07-24: 2 mL via INTRAVENOUS
  Filled 2021-07-24: qty 10

## 2021-07-24 MED ORDER — PROCHLORPERAZINE EDISYLATE 10 MG/2ML IJ SOLN
10.0000 mg | Freq: Once | INTRAMUSCULAR | Status: AC
  Administered 2021-07-24: 10 mg via INTRAVENOUS
  Filled 2021-07-24: qty 2

## 2021-07-24 MED ORDER — KETOROLAC TROMETHAMINE 30 MG/ML IJ SOLN
30.0000 mg | Freq: Once | INTRAMUSCULAR | Status: AC
  Administered 2021-07-24: 30 mg via INTRAVENOUS
  Filled 2021-07-24: qty 1

## 2021-07-24 NOTE — Progress Notes (Signed)
PROGRESS NOTE    Todd Osborn  GLO:[id number] DOB: 1960/12/05 DOA: 07/23/2021 PCP: Azzie Glatter, FNP    Chief Complaint  Patient presents with   GI Bleeding    Brief Narrative: Patient 61 year old gentleman history of hypertension, extensive left-sided diverticulosis presented to the ED with 3-4 episodes of rectal bleeding/bloody bowel movements with some associated lightheadedness, dizziness and syncopal episode.  Patient seen in the ED hemoglobin noted to be stable.  CT angiogram abdomen and pelvis done was negative.  Patient admitted serial CBCs ordered.  Patient placed on IV fluids.  GI consulted who assessed the patient felt likely diverticular bleed and recommended if patient develops any further episodes of recurrent hematochezia to get a stat RBC tagged scan to localize site of bleeding and consult with IR.   Assessment & Plan:   Principal Problem:   GIB (gastrointestinal bleeding) Active Problems:   Acute blood loss anemia   Syncope   Elevated troponin  #1 GI bleed -Patient presented with 3-4 episodes of bloody bowel movements/rectal bleeding, painless hematochezia. -Patient with prior history of extensive left-sided diverticular disease per colonoscopy of 2018.  Patient denies any significant daily NSAID use. -Patient with no abdominal pain. -CT angiogram abdomen and pelvis done negative for any active extravasation. -Patient seen in consultation by GI who feel patient may have a diverticular bleed recommending stat RBC tagged scan to localize site of bleeding if patient with recurrent hematochezia with IR consult for embolization. -Hemoglobin currently at 9.7 this morning from 11.5 on admission.  Hemoglobin noted at 13.2 02/20/2017. -Continue IV PPI. -IV fluids. -Check an anemia panel. -Continue serial H&H. -Patient noted to have 2 episodes of hematochezia today.  Initially ordered a stat RBC tagged scan and spoke with Dr. Kathlene Cote. -Spoke with Dr. Benson Norway, who  states to hold off on tagged red blood cell scan and monitor patient and only order tagged red blood cell scan if patient becomes hemodynamically unstable with rapid bleeding. -Updated IR, Dr. Kathlene Cote who felt the patient does have significant rapid bleeding may benefit from repeat CT angiogram abdomen and pelvis versus tagged red blood cell scan. -Continue full liquid diet. -GI following and appreciate input and recommendations.  2.  Acute blood loss anemia -Secondary to problem #1. -Serial CBCs. -Transfusion threshold hemoglobin < 8.  -Follow.  3.  Syncope -Likely secondary to problem #1. -No further episodes. -Follow-up.  4.  Elevated troponin -Patient with mildly elevated troponin likely demand ischemia secondary to problem #1. -Patient denies any chest pain. -EKG with nonspecific intraventricular conduction delay. -2D echo pending.  5.  Hyperglycemia -No history of diabetes. -Hemoglobin A1c 5.6 (12/19/2017) -Repeat hemoglobin A1c pending   DVT prophylaxis: SCDs Code Status: Full Family Communication: Updated patient.  No family at bedside. Disposition:   Status is: Observation  The patient remains OBS appropriate and will d/c before 2 midnights.      Consultants:  Gastroenterology: Dr. Silverio Decamp 07/23/2021  Procedures:  CT angiogram abdomen and pelvis 07/23/2021 Chest x-ray 07/23/2021 2D echo 07/24/2021 pending Tagged red blood cell scan pending 07/24/2021     Antimicrobials:  None   Subjective: Patient states had 2 bloody bowel movements this morning 1 an hour ago and one just now around 11:15 AM.  Describes them as bright red blood.  Denies any chest pain.  No shortness of breath.  No lightheadedness.  No dizziness.  Objective: Vitals:   07/23/21 2156 07/23/21 2251 07/24/21 0230 07/24/21 0533  BP: (!) 132/46 (!) 135/58 (!) 120/59 [name])  147/97  Pulse: 67 70 69 67  Resp: 18  18 18   Temp: 98.2 F (36.8 C)  98.1 F (36.7 C) 98 F (36.7 C)  TempSrc: Oral   Oral Oral  SpO2: 97%  95% 99%  Weight:      Height:        Intake/Output Summary (Last 24 hours) at 07/24/2021 1142 Last data filed at 07/24/2021 0606 Gross per 24 hour  Intake 50 ml  Output 1 ml  Net 49 ml   Filed Weights   07/23/21 0959  Weight: 129.3 kg    Examination:  General exam: Appears calm and comfortable  Respiratory system: Clear to auscultation. Respiratory effort normal. Cardiovascular system: S1 & S2 heard, RRR. No JVD, murmurs, rubs, gallops or clicks. No pedal edema. Gastrointestinal system: Abdomen is obese, mildly distended, soft, nontender to palpation.  Positive bowel sounds.  No rebound.  No guarding. Central nervous system: Alert and oriented. No focal neurological deficits. Extremities: Symmetric 5 x 5 power. Skin: No rashes, lesions or ulcers Psychiatry: Judgement and insight appear normal. Mood & affect appropriate.     Data Reviewed: I have personally reviewed following labs and imaging studies  CBC: Recent Labs  Lab 07/23/21 1012 07/23/21 1342 07/23/21 1914 07/24/21 0108 07/24/21 0746  WBC 12.6*  --   --  12.6*  --   HGB 11.5* 10.6* 10.9* 9.8* 9.7*  HCT 35.0* 32.0* 33.3* 29.9* 29.5*  MCV 89.3  --   --  88.5  --   PLT 248  --   --  258  --     Basic Metabolic Panel: Recent Labs  Lab 07/23/21 1012 07/24/21 0108  NA 139 136  K 4.0 3.8  CL 110 105  CO2 23 25  GLUCOSE 173* 126*  BUN 24* 15  CREATININE 1.04 0.86  CALCIUM 7.9* 8.1*    GFR: Estimated Creatinine Clearance: 125.4 mL/min (by C-G formula based on SCr of 0.86 mg/dL).  Liver Function Tests: Recent Labs  Lab 07/23/21 1012 07/24/21 0108  AST 18 18  ALT 21 19  ALKPHOS 41 36*  BILITOT 0.2* 0.8  PROT 6.4* 6.2*  ALBUMIN 3.4* 3.3*    CBG: No results for input(s): GLUCAP in the last 168 hours.   Recent Results (from the past 240 hour(s))  Resp Panel by RT-PCR (Flu A&B, Covid) Nasopharyngeal Swab     Status: None   Collection Time: 07/23/21 10:59 AM   Specimen:  Nasopharyngeal Swab; Nasopharyngeal(NP) swabs in vial transport medium  Result Value Ref Range Status   SARS Coronavirus 2 by RT PCR NEGATIVE NEGATIVE Final    Comment: (NOTE) SARS-CoV-2 target nucleic acids are NOT DETECTED.  The SARS-CoV-2 RNA is generally detectable in upper respiratory specimens during the acute phase of infection. The lowest concentration of SARS-CoV-2 viral copies this assay can detect is 138 copies/mL. A negative result does not preclude SARS-Cov-2 infection and should not be used as the sole basis for treatment or other patient management decisions. A negative result may occur with  improper specimen collection/handling, submission of specimen other than nasopharyngeal swab, presence of viral mutation(s) within the areas targeted by this assay, and inadequate number of viral copies(<138 copies/mL). A negative result must be combined with clinical observations, patient history, and epidemiological information. The expected result is Negative.  Fact Sheet for Patients:  EntrepreneurPulse.com.au  Fact Sheet for Healthcare Providers:  IncredibleEmployment.be  This test is no t yet approved or cleared by the [place] and  has been authorized for detection and/or diagnosis of SARS-CoV-2 by FDA under an Emergency Use Authorization (EUA). This EUA will remain  in effect (meaning this test can be used) for the duration of the COVID-19 declaration under Section 564(b)(1) of the Act, 21 U.S.C.section 360bbb-3(b)(1), unless the authorization is terminated  or revoked sooner.       Influenza A by PCR NEGATIVE NEGATIVE Final   Influenza B by PCR NEGATIVE NEGATIVE Final    Comment: (NOTE) The Xpert Xpress SARS-CoV-2/FLU/RSV plus assay is intended as an aid in the diagnosis of influenza from Nasopharyngeal swab specimens and should not be used as a sole basis for treatment. Nasal washings and aspirates are unacceptable for  Xpert Xpress SARS-CoV-2/FLU/RSV testing.  Fact Sheet for Patients: EntrepreneurPulse.com.au  Fact Sheet for Healthcare Providers: IncredibleEmployment.be  This test is not yet approved or cleared by the [place] FDA and has been authorized for detection and/or diagnosis of SARS-CoV-2 by FDA under an Emergency Use Authorization (EUA). This EUA will remain in effect (meaning this test can be used) for the duration of the COVID-19 declaration under Section 564(b)(1) of the Act, 21 U.S.C. section 360bbb-3(b)(1), unless the authorization is terminated or revoked.  Performed at [facility], [place] [address], [place],  [address]          Radiology Studies: [facility] Chest Port 1 View  Result Date: 07/23/2021 CLINICAL DATA:  Syncope and rectal bleeding. EXAM: PORTABLE CHEST 1 VIEW COMPARISON:  Chest x-ray dated June 23, 2021. FINDINGS: The heart size and mediastinal contours are within normal limits. Both lungs are clear. The visualized skeletal structures are unremarkable. IMPRESSION: No active disease. Electronically Signed   By: Titus Dubin M.D.   On: 07/23/2021 12:18   CT Angio Abd/Pel w/ and/or w/o  Result Date: 07/23/2021 CLINICAL DATA:  Hypertension, rectal bleeding EXAM: CTA ABDOMEN AND PELVIS WITHOUT AND WITH CONTRAST TECHNIQUE: Multidetector CT imaging of the abdomen and pelvis was performed using the standard protocol during bolus administration of intravenous contrast. Multiplanar reconstructed images and MIPs were obtained and reviewed to evaluate the vascular anatomy. RADIATION DOSE REDUCTION: This exam was performed according to the departmental dose-optimization program which includes automated exposure control, adjustment of the mA and/or kV according to patient size and/or use of iterative reconstruction technique. CONTRAST:  142mL OMNIPAQUE IOHEXOL 350 MG/ML SOLN COMPARISON:  None. FINDINGS: VASCULAR Aorta:  Minimal calcified atheromatous plaque. No aneurysm, dissection, or stenosis. Celiac: Patent without evidence of aneurysm, dissection, vasculitis or significant stenosis. SMA: Patent without evidence of aneurysm, dissection, vasculitis or significant stenosis. Renals: Single left, widely patent. Duplicated right, superior dominant, both patent. IMA: Patent without evidence of aneurysm, dissection, vasculitis or significant stenosis. Inflow: Minimal calcified plaque. No aneurysm, dissection, or stenosis. Proximal Outflow: Minimally atheromatous, patent Veins: Portal vein patent.  No venous pathology evident. Review of the MIP images confirms the above findings. NON-VASCULAR Lower chest: No pleural or pericardial effusion. Visualized lung bases clear. Hepatobiliary: No focal liver abnormality is seen. No gallstones, gallbladder wall thickening, or biliary dilatation. Pancreas: Unremarkable. No pancreatic ductal dilatation or surrounding inflammatory changes. Spleen: Normal in size.  1.6 cm mid splenic cyst. Adrenals/Urinary Tract: 2.5 cm right adrenal adenoma. No urolithiasis or hydronephrosis. Probable parapelvic cysts bilaterally. Urinary bladder is incompletely distended. Stomach/Bowel: Stomach is nondistended. Small bowel decompressed. Normal appendix. The colon is nondilated. Multiple descending and sigmoid diverticula without adjacent inflammatory change. No active extravasation. Lymphatic: No abdominal or pelvic adenopathy. Reproductive: Mild prostate enlargement with central coarse  calcifications. Other: No ascites.  No free air. Musculoskeletal: Mild bilateral hip DJD. Spondylitic changes in the lower lumbar spine. IMPRESSION: 1. No evidence of active GI bleed. 2. Descending and sigmoid diverticulosis 3.  Aortic Atherosclerosis (ICD10-170.0). Electronically Signed   By: Lucrezia Europe M.D.   On: 07/23/2021 15:00        Scheduled Meds:  pantoprazole (PROTONIX) IV  40 mg Intravenous Q12H   sodium chloride  flush  3 mL Intravenous Q12H   Continuous Infusions:  dextrose 5 % and 0.45% NaCl 100 mL/hr at 07/24/21 1017     LOS: 0 days    Time spent: 40 minutes    Irine Seal, MD [facility]   To contact the attending provider between 7A-7P or the covering provider during after hours 7P-7A, please log into the web site [email or web address] and access using universal [place] password for that web site. If you do not have the password, please call the hospital operator.  07/24/2021, 11:42 AM

## 2021-07-24 NOTE — Progress Notes (Signed)
Was called by RN that patient had 2 bloody bowel movements early on.  Patient noted with systolic blood pressures in the 100s at that time with some associated dizziness..  1 Liter bolus of normal saline ordered and transfused.  Repeat H&H obtained with hemoglobin now at 8.3 from 11.5 on admission.  1 by reassessed patient 1 to 2 hours later patient currently stable with systolic blood pressures now in the 120s.  Due to acute bleed and drop in hemoglobin we will transfuse 1 unit of packed red blood cells, 1 to 2 units ahead at the blood bank.  Serial CBCs.  If patient has further bleeding with hemodynamic instability we will repeat CT angiogram of abdomen and pelvis and alert IR, Dr. Kathlene Cote for need for evaluation for possible embolization.  We will transfer the patient to stepdown unit for closer monitoring.  Case discussed with GI, Dr. Benson Norway who is in agreement.

## 2021-07-24 NOTE — Plan of Care (Signed)
Patient currently on bedrest with bathroom privilege. Patient will remain free of falls and ensure safety while ambulating.

## 2021-07-24 NOTE — Progress Notes (Signed)
Subjective: Hematochezia just now.  Objective: Vital signs in last 24 hours: Temp:  [98 F (36.7 C)-98.2 F (36.8 C)] 98 F (36.7 C) (01/21 0533) Pulse Rate:  [54-75] 67 (01/21 0533) Resp:  [12-21] 18 (01/21 0533) BP: (117-153)/(46-97) 147/97 (01/21 0533) SpO2:  [93 %-100 %] 99 % (01/21 0533) Last BM Date:  (pt reports last bloody stool 1330 07/23/21)  Intake/Output from previous day: 01/20 0701 - 01/21 0700 In: 50 [IV Piggyback:50] Out: 1 [Urine:1] Intake/Output this shift: No intake/output data recorded.  General appearance: alert and no distress GI: soft, non-tender; bowel sounds normal; no masses,  no organomegaly  Lab Results: Recent Labs    07/23/21 1012 07/23/21 1342 07/23/21 1914 07/24/21 0108 07/24/21 0746  WBC 12.6*  --   --  12.6*  --   HGB 11.5*   < > 10.9* 9.8* 9.7*  HCT 35.0*   < > 33.3* 29.9* 29.5*  PLT 248  --   --  258  --    < > = values in this interval not displayed.   BMET Recent Labs    07/23/21 1012 07/24/21 0108  NA 139 136  K 4.0 3.8  CL 110 105  CO2 23 25  GLUCOSE 173* 126*  BUN 24* 15  CREATININE 1.04 0.86  CALCIUM 7.9* 8.1*   LFT Recent Labs    07/24/21 0108  PROT 6.2*  ALBUMIN 3.3*  AST 18  ALT 19  ALKPHOS 36*  BILITOT 0.8   PT/INR Recent Labs    07/23/21 1009  LABPROT 13.9  INR 1.1   Hepatitis Panel No results for input(s): HEPBSAG, HCVAB, HEPAIGM, HEPBIGM in the last 72 hours. C-Diff No results for input(s): CDIFFTOX in the last 72 hours. Fecal Lactopherrin No results for input(s): FECLLACTOFRN in the last 72 hours.  Studies/Results: DG Chest Port 1 View  Result Date: 07/23/2021 CLINICAL DATA:  Syncope and rectal bleeding. EXAM: PORTABLE CHEST 1 VIEW COMPARISON:  Chest x-ray dated June 23, 2021. FINDINGS: The heart size and mediastinal contours are within normal limits. Both lungs are clear. The visualized skeletal structures are unremarkable. IMPRESSION: No active disease. Electronically Signed   By:  Titus Dubin M.D.   On: 07/23/2021 12:18   CT Angio Abd/Pel w/ and/or w/o  Result Date: 07/23/2021 CLINICAL DATA:  Hypertension, rectal bleeding EXAM: CTA ABDOMEN AND PELVIS WITHOUT AND WITH CONTRAST TECHNIQUE: Multidetector CT imaging of the abdomen and pelvis was performed using the standard protocol during bolus administration of intravenous contrast. Multiplanar reconstructed images and MIPs were obtained and reviewed to evaluate the vascular anatomy. RADIATION DOSE REDUCTION: This exam was performed according to the departmental dose-optimization program which includes automated exposure control, adjustment of the mA and/or kV according to patient size and/or use of iterative reconstruction technique. CONTRAST:  172mL OMNIPAQUE IOHEXOL 350 MG/ML SOLN COMPARISON:  None. FINDINGS: VASCULAR Aorta: Minimal calcified atheromatous plaque. No aneurysm, dissection, or stenosis. Celiac: Patent without evidence of aneurysm, dissection, vasculitis or significant stenosis. SMA: Patent without evidence of aneurysm, dissection, vasculitis or significant stenosis. Renals: Single left, widely patent. Duplicated right, superior dominant, both patent. IMA: Patent without evidence of aneurysm, dissection, vasculitis or significant stenosis. Inflow: Minimal calcified plaque. No aneurysm, dissection, or stenosis. Proximal Outflow: Minimally atheromatous, patent Veins: Portal vein patent.  No venous pathology evident. Review of the MIP images confirms the above findings. NON-VASCULAR Lower chest: No pleural or pericardial effusion. Visualized lung bases clear. Hepatobiliary: No focal liver abnormality is seen. No gallstones, gallbladder wall thickening,  or biliary dilatation. Pancreas: Unremarkable. No pancreatic ductal dilatation or surrounding inflammatory changes. Spleen: Normal in size.  1.6 cm mid splenic cyst. Adrenals/Urinary Tract: 2.5 cm right adrenal adenoma. No urolithiasis or hydronephrosis. Probable parapelvic  cysts bilaterally. Urinary bladder is incompletely distended. Stomach/Bowel: Stomach is nondistended. Small bowel decompressed. Normal appendix. The colon is nondilated. Multiple descending and sigmoid diverticula without adjacent inflammatory change. No active extravasation. Lymphatic: No abdominal or pelvic adenopathy. Reproductive: Mild prostate enlargement with central coarse calcifications. Other: No ascites.  No free air. Musculoskeletal: Mild bilateral hip DJD. Spondylitic changes in the lower lumbar spine. IMPRESSION: 1. No evidence of active GI bleed. 2. Descending and sigmoid diverticulosis 3.  Aortic Atherosclerosis (ICD10-170.0). Electronically Signed   By: Lucrezia Europe M.D.   On: 07/23/2021 15:00    Medications: Scheduled:  pantoprazole (PROTONIX) IV  40 mg Intravenous Q12H   sodium chloride flush  3 mL Intravenous Q12H   Continuous:  dextrose 5 % and 0.45% NaCl 100 mL/hr at 07/24/21 1017    Assessment/Plan: 1) Diverticular bleed. 2) Anemia - stable.   He did have some more bleeding, but it was not severe.  The patient remains hemodynamically stable and he does not have any complaints.  Plan: 1) Continue to monitor HGB. 2) Transfuse as needed. 3) Anticipate a discharge when he does not have any bleeding in 24 hours.  LOS: 0 days   Nahom Carfagno D 07/24/2021, 11:22 AM

## 2021-07-25 ENCOUNTER — Inpatient Hospital Stay (HOSPITAL_COMMUNITY): Payer: BC Managed Care – PPO

## 2021-07-25 ENCOUNTER — Encounter (HOSPITAL_COMMUNITY): Payer: Self-pay | Admitting: Internal Medicine

## 2021-07-25 DIAGNOSIS — E538 Deficiency of other specified B group vitamins: Secondary | ICD-10-CM

## 2021-07-25 LAB — PREPARE RBC (CROSSMATCH)

## 2021-07-25 LAB — BASIC METABOLIC PANEL
Anion gap: 5 (ref 5–15)
BUN: 18 mg/dL (ref 8–23)
CO2: 22 mmol/L (ref 22–32)
Calcium: 7.4 mg/dL — ABNORMAL LOW (ref 8.9–10.3)
Chloride: 110 mmol/L (ref 98–111)
Creatinine, Ser: 0.82 mg/dL (ref 0.61–1.24)
GFR, Estimated: >60 mL/min (ref >60)
Glucose, Bld: 158 mg/dL — ABNORMAL HIGH (ref 70–99)
Potassium: 4 mmol/L (ref 3.5–5.1)
Sodium: 137 mmol/L (ref 135–145)

## 2021-07-25 LAB — HEMOGLOBIN AND HEMATOCRIT, BLOOD
HCT: 24.3 % — ABNORMAL LOW (ref 39.0–52.0)
HCT: 22.2 % — ABNORMAL LOW (ref 39.0–52.0)
HCT: 25.3 % — ABNORMAL LOW (ref 39.0–52.0)
HCT: 23.3 % — ABNORMAL LOW (ref 39.0–52.0)
Hemoglobin: 8.2 g/dL — ABNORMAL LOW (ref 13.0–17.0)
Hemoglobin: 7.5 g/dL — ABNORMAL LOW (ref 13.0–17.0)
Hemoglobin: 8.6 g/dL — ABNORMAL LOW (ref 13.0–17.0)
Hemoglobin: 7.6 g/dL — ABNORMAL LOW (ref 13.0–17.0)

## 2021-07-25 LAB — GLUCOSE, CAPILLARY
Glucose-Capillary: 178 mg/dL — ABNORMAL HIGH (ref 70–99)
Glucose-Capillary: 169 mg/dL — ABNORMAL HIGH (ref 70–99)

## 2021-07-25 LAB — MAGNESIUM: Magnesium: 1.8 mg/dL (ref 1.7–2.4)

## 2021-07-25 MED ORDER — SODIUM CHLORIDE 0.9 % IV BOLUS
1000.0000 mL | Freq: Once | INTRAVENOUS | Status: AC
  Administered 2021-07-25: 1000 mL via INTRAVENOUS

## 2021-07-25 MED ORDER — FUROSEMIDE 10 MG/ML IJ SOLN
20.0000 mg | Freq: Once | INTRAMUSCULAR | Status: AC
  Administered 2021-07-25: 20 mg via INTRAVENOUS
  Filled 2021-07-25: qty 2

## 2021-07-25 MED ORDER — DIPHENHYDRAMINE HCL 25 MG PO CAPS
25.0000 mg | ORAL_CAPSULE | Freq: Once | ORAL | Status: AC
  Administered 2021-07-25: 25 mg via ORAL
  Filled 2021-07-25: qty 1

## 2021-07-25 MED ORDER — FUROSEMIDE 10 MG/ML IJ SOLN
20.0000 mg | Freq: Once | INTRAMUSCULAR | Status: AC
  Administered 2021-07-26: 20 mg via INTRAVENOUS
  Filled 2021-07-25: qty 2

## 2021-07-25 MED ORDER — ACETAMINOPHEN 325 MG PO TABS
650.0000 mg | ORAL_TABLET | Freq: Once | ORAL | Status: AC
  Administered 2021-07-25: 650 mg via ORAL
  Filled 2021-07-25: qty 2

## 2021-07-25 MED ORDER — SODIUM CHLORIDE 0.9% IV SOLUTION: Freq: Once | INTRAVENOUS | Status: AC

## 2021-07-25 MED ORDER — SODIUM CHLORIDE (PF) 0.9 % IJ SOLN
INTRAMUSCULAR | Status: AC
  Filled 2021-07-25: qty 50

## 2021-07-25 MED ORDER — IOHEXOL 350 MG/ML SOLN
100.0000 mL | Freq: Once | INTRAVENOUS | Status: AC | PRN
  Administered 2021-07-25: 100 mL via INTRAVENOUS

## 2021-07-25 NOTE — Progress Notes (Addendum)
PROGRESS NOTE    Todd Osborn  XIP:[id number] DOB: Jul 29, 1960 DOA: 07/23/2021 PCP: Azzie Glatter, FNP    Chief Complaint  Patient presents with   GI Bleeding    Brief Narrative: Patient 61 year old gentleman history of hypertension, extensive left-sided diverticulosis presented to the ED with 3-4 episodes of rectal bleeding/bloody bowel movements with some associated lightheadedness, dizziness and syncopal episode.  Patient seen in the ED hemoglobin noted to be stable.  CT angiogram abdomen and pelvis done was negative.  Patient admitted serial CBCs ordered.  Patient placed on IV fluids.  GI consulted who assessed the patient felt likely diverticular bleed and recommended if patient develops any further episodes of recurrent hematochezia to get a stat RBC tagged scan to localize site of bleeding and consult with IR.   Assessment & Plan:   Principal Problem:   GIB (gastrointestinal bleeding) Active Problems:   Acute blood loss anemia   Syncope   Elevated troponin   Vitamin B12 deficiency  #1 GI bleed -Patient presented with 3-4 episodes of bloody bowel movements/rectal bleeding, painless hematochezia. -Patient with prior history of extensive left-sided diverticular disease per colonoscopy of 2018.  Patient denies any significant daily NSAID use. -Patient with no abdominal pain. -CT angiogram abdomen and pelvis done on admission, negative for any active extravasation. -Patient seen in consultation by GI who feel patient may have a diverticular bleed recommending stat RBC tagged scan to localize site of bleeding if patient with recurrent hematochezia with IR consult for embolization on initial admission.. -Hemoglobin currently at 7.5 from 8.2 from 7.0 from 8.3 from 9.7 this morning from 11.5 on admission.  Hemoglobin noted at 13.2 02/20/2017. -Status post transfusion 1 unit packed red blood cells 07/24/2021 with hemoglobin at 7.5 this morning. -Patient noted with bloody bowel  movements overnight. -Decrease IV fluid rate to 75 cc an hour. -Anemia panel consistent with anemia of chronic disease with iron level of 58, TIBC of 232 folate of 15.1, vitamin B12 of 148. -Continue serial H&H -Patient noted to have bloody bowel movements yesterday, spoke with Dr. Benson Norway, who stated to hold off on tagged red blood cell scan and monitor patient and only order tagged red blood cell scan if patient becomes hemodynamically unstable with rapid bleeding. -Updated IR, Dr. Kathlene Cote who felt that if patient does have significant rapid bleeding may benefit from repeat CT angiogram abdomen and pelvis. -Continue full liquid diet. -We will transfuse 2 more units packed red blood cells and follow H&H -GI following and appreciate input and recommendations.  Addendum 6:04 PM:  Patient with large bloody bowel movement per RN around 5 PM.  Patient status post transfusion 2 units packed red blood cells early on this morning. -Repeat a stat H&H and every 6 hours. -CT angiogram abdomen and pelvis ordered stat. -Notified IR, Dr. Kathlene Cote for possible embolization pending CT angiogram results. -We will make patient n.p.o. pending CT angiogram results.  2.  Acute blood loss anemia/vitamin B12 deficiency -Secondary to problem #1. -Serial CBCs. -Transfusion threshold hemoglobin < 8.  -Patient noted to have a vitamin B12 deficiency with vitamin B12 levels at 148. -Patient started on vitamin B12 supplementation with 1000 MCG's subcu daily x7 days, then weekly x1 month, then monthly. -Could potentially discharge on oral vitamin B12 supplementation. -Outpatient follow-up. -Follow.  3.  Syncope -Likely secondary to problem #1. -No further episodes. -Follow-up.  4.  Elevated troponin -Patient with mildly elevated troponin likely demand ischemia secondary to problem #1. -Patient denies any chest pain. -  EKG with nonspecific intraventricular conduction delay. -2D echo with a EF of 55 to 60%, NWMA,  grade 2 diastolic dysfunction, mildly dilated left atrial size, normal right ventricular systolic function, trivial MVR, no evidence of mitral stenosis.   -No further cardiac work-up needed at this time.   5.  Hyperglycemia -No history of diabetes. -Hemoglobin A1c 5.6 (12/19/2017) -Repeat hemoglobin A1c pending   DVT prophylaxis: SCDs Code Status: Full Family Communication: Updated patient.  No family at bedside. Disposition:   Status is: Observation  The patient remains OBS appropriate and will d/c before 2 midnights.      Consultants:  Gastroenterology: Dr. Silverio Decamp 07/23/2021  Procedures:  CT angiogram abdomen and pelvis 07/23/2021 Chest x-ray 07/23/2021 2D echo 07/24/2021  Transfusion 1 unit packed red blood cells 07/24/2021 Transfusion 2 units packed red blood cells pending 07/25/2021     Antimicrobials:  None   Subjective: Patient noted to have bloody bowel movements overnight.  Has not had a bowel movement yet this morning.  Overall feeling better than he did on admission.  No further syncopal episodes.  No chest pain.  No shortness of breath.  Denies any dizziness or lightheadedness this morning.  Noted to have dizziness and lightheadedness with bloody bowel movements.  Patient transferred to stepdown unit yesterday for closer monitoring.  Objective: Vitals:   07/25/21 0900 07/25/21 0922 07/25/21 0930 07/25/21 0935  BP: 134/69 126/68    Pulse: (!) 106 (!) 108 (!) 103 (!) 103  Resp: (!) 24 16 (!) 25 (!) 7  Temp:  98.2 F (36.8 C)    TempSrc:  Oral    SpO2: 98% 98% 99% 97%  Weight:      Height:        Intake/Output Summary (Last 24 hours) at 07/25/2021 0949 Last data filed at 07/25/2021 0919 Gross per 24 hour  Intake 4374.18 ml  Output 2 ml  Net 4372.18 ml    Filed Weights   07/23/21 0959  Weight: 129.3 kg    Examination:  General exam: NAD. Respiratory system: CTA B.  No wheezes, no crackles, no rhonchi.  Normal respiratory effort.  Speaking in  full sentences.   Cardiovascular system: Regular rate rhythm no murmurs rubs or gallops.  No JVD.  Trace to 1+ bilateral lower extremity edema.  Gastrointestinal system: Abdomen is obese, soft, mildly distended, positive bowel sounds.  Nontender to palpation.  No rebound.  No guarding.  Central nervous system: Alert and oriented. No focal neurological deficits. Extremities: Symmetric 5 x 5 power. Skin: No rashes, lesions or ulcers Psychiatry: Judgement and insight appear normal. Mood & affect appropriate.     Data Reviewed: I have personally reviewed following labs and imaging studies  CBC: Recent Labs  Lab 07/23/21 1012 07/23/21 1342 07/24/21 0108 07/24/21 0746 07/24/21 1325 07/24/21 2112 07/25/21 0249 07/25/21 0827  WBC 12.6*  --  12.6*  --   --   --   --   --   HGB 11.5*   < > 9.8* 9.7* 8.3* 7.0* 8.2* 7.5*  HCT 35.0*   < > 29.9* 29.5* 24.8* 21.2* 24.3* 22.2*  MCV 89.3  --  88.5  --   --   --   --   --   PLT 248  --  258  --   --   --   --   --    < > = values in this interval not displayed.     Basic Metabolic Panel: Recent Labs  Lab 07/23/21  1012 07/24/21 0108 07/25/21 0249  NA 139 136 137  K 4.0 3.8 4.0  CL 110 105 110  CO2 23 25 22   GLUCOSE 173* 126* 158*  BUN 24* 15 18  CREATININE 1.04 0.86 0.82  CALCIUM 7.9* 8.1* 7.4*  MG  --   --  1.8     GFR: Estimated Creatinine Clearance: 131.5 mL/min (by C-G formula based on SCr of 0.82 mg/dL).  Liver Function Tests: Recent Labs  Lab 07/23/21 1012 07/24/21 0108  AST 18 18  ALT 21 19  ALKPHOS 41 36*  BILITOT 0.2* 0.8  PROT 6.4* 6.2*  ALBUMIN 3.4* 3.3*     CBG: Recent Labs  Lab 07/25/21 0745  GLUCAP 178*     Recent Results (from the past 240 hour(s))  Resp Panel by RT-PCR (Flu A&B, Covid) Nasopharyngeal Swab     Status: None   Collection Time: 07/23/21 10:59 AM   Specimen: Nasopharyngeal Swab; Nasopharyngeal(NP) swabs in vial transport medium  Result Value Ref Range Status   SARS Coronavirus 2  by RT PCR NEGATIVE NEGATIVE Final    Comment: (NOTE) SARS-CoV-2 target nucleic acids are NOT DETECTED.  The SARS-CoV-2 RNA is generally detectable in upper respiratory specimens during the acute phase of infection. The lowest concentration of SARS-CoV-2 viral copies this assay can detect is 138 copies/mL. A negative result does not preclude SARS-Cov-2 infection and should not be used as the sole basis for treatment or other patient management decisions. A negative result may occur with  improper specimen collection/handling, submission of specimen other than nasopharyngeal swab, presence of viral mutation(s) within the areas targeted by this assay, and inadequate number of viral copies(<138 copies/mL). A negative result must be combined with clinical observations, patient history, and epidemiological information. The expected result is Negative.  Fact Sheet for Patients:  EntrepreneurPulse.com.au  Fact Sheet for Healthcare Providers:  IncredibleEmployment.be  This test is no t yet approved or cleared by the [place] FDA and  has been authorized for detection and/or diagnosis of SARS-CoV-2 by FDA under an Emergency Use Authorization (EUA). This EUA will remain  in effect (meaning this test can be used) for the duration of the COVID-19 declaration under Section 564(b)(1) of the Act, 21 U.S.C.section 360bbb-3(b)(1), unless the authorization is terminated  or revoked sooner.       Influenza A by PCR NEGATIVE NEGATIVE Final   Influenza B by PCR NEGATIVE NEGATIVE Final    Comment: (NOTE) The Xpert Xpress SARS-CoV-2/FLU/RSV plus assay is intended as an aid in the diagnosis of influenza from Nasopharyngeal swab specimens and should not be used as a sole basis for treatment. Nasal washings and aspirates are unacceptable for Xpert Xpress SARS-CoV-2/FLU/RSV testing.  Fact Sheet for Patients: EntrepreneurPulse.com.au  Fact  Sheet for Healthcare Providers: IncredibleEmployment.be  This test is not yet approved or cleared by the [place] FDA and has been authorized for detection and/or diagnosis of SARS-CoV-2 by FDA under an Emergency Use Authorization (EUA). This EUA will remain in effect (meaning this test can be used) for the duration of the COVID-19 declaration under Section 564(b)(1) of the Act, 21 U.S.C. section 360bbb-3(b)(1), unless the authorization is terminated or revoked.  Performed at [facility], [place] [address], [place], [place] [address]   MRSA Next Gen by PCR, Nasal     Status: None   Collection Time: 07/24/21  3:58 PM   Specimen: Nasal Mucosa; Nasal Swab  Result Value Ref Range Status   MRSA by PCR Next  Gen NOT DETECTED NOT DETECTED Final    Comment: (NOTE) The GeneXpert MRSA Assay (FDA approved for NASAL specimens only), is one component of a comprehensive MRSA colonization surveillance program. It is not intended to diagnose MRSA infection nor to guide or monitor treatment for MRSA infections. Test performance is not FDA approved in patients less than 32 years old. Performed at [facility], [place] [address] [place], [place] [address]           Radiology Studies: [facility] Chest Port 1 View  Result Date: 07/23/2021 CLINICAL DATA:  Syncope and rectal bleeding. EXAM: PORTABLE CHEST 1 VIEW COMPARISON:  Chest x-ray dated June 23, 2021. FINDINGS: The heart size and mediastinal contours are within normal limits. Both lungs are clear. The visualized skeletal structures are unremarkable. IMPRESSION: No active disease. Electronically Signed   By: Titus Dubin M.D.   On: 07/23/2021 12:18   ECHOCARDIOGRAM COMPLETE  Result Date: 07/24/2021    ECHOCARDIOGRAM REPORT   Patient Name:   Todd Osborn Date of Exam: 07/24/2021 Medical Rec #:  [id number]     Height:       72.0 in Accession #:    [id number]    Weight:       285.0 lb Date of  Birth:  06-25-1961     BSA:          2.477 m Patient Age:    11 years      BP:           147/97 mmHg Patient Gender: M             HR:           58 bpm. Exam Location:  Inpatient Procedure: 2D Echo, Cardiac Doppler, Color Doppler and Intracardiac            Opacification Agent Indications:    Syncope  History:        Patient has no prior history of Echocardiogram examinations.                 Diverticulitis. Blood in stool, "Passed out yesterday".  Sonographer:    Merrie Roof RDCS Referring Phys: [id number] [place]  1. Left ventricular ejection fraction, by estimation, is 55 to 60%. The left ventricle has normal function. The left ventricle has no regional wall motion abnormalities. There is mild left ventricular hypertrophy. Left ventricular diastolic parameters are consistent with Grade II diastolic dysfunction (pseudonormalization). Elevated left ventricular end-diastolic pressure.  2. Right ventricular systolic function is normal. The right ventricular size is normal.  3. Left atrial size was mildly dilated.  4. The mitral valve is abnormal. Trivial mitral valve regurgitation. No evidence of mitral stenosis.  5. The aortic valve is tricuspid. There is mild calcification of the aortic valve. There is mild thickening of the aortic valve. Aortic valve regurgitation is not visualized. Aortic valve sclerosis/calcification is present, without any evidence of aortic stenosis.  6. The inferior vena cava is normal in size with greater than 50% respiratory variability, suggesting right atrial pressure of 3 mmHg. FINDINGS  Left Ventricle: Left ventricular ejection fraction, by estimation, is 55 to 60%. The left ventricle has normal function. The left ventricle has no regional wall motion abnormalities. Definity contrast agent was given IV to delineate the left ventricular  endocardial borders. The left ventricular internal cavity size was normal in size. There is mild left ventricular hypertrophy. Left  ventricular diastolic parameters are consistent with Grade II diastolic dysfunction (pseudonormalization).  Elevated left ventricular end-diastolic pressure. Right Ventricle: The right ventricular size is normal. No increase in right ventricular wall thickness. Right ventricular systolic function is normal. Left Atrium: Left atrial size was mildly dilated. Right Atrium: Right atrial size was normal in size. Pericardium: There is no evidence of pericardial effusion. Mitral Valve: The mitral valve is abnormal. There is mild thickening of the mitral valve leaflet(s). There is mild calcification of the mitral valve leaflet(s). Trivial mitral valve regurgitation. No evidence of mitral valve stenosis. Tricuspid Valve: The tricuspid valve is normal in structure. Tricuspid valve regurgitation is trivial. No evidence of tricuspid stenosis. Aortic Valve: The aortic valve is tricuspid. There is mild calcification of the aortic valve. There is mild thickening of the aortic valve. Aortic valve regurgitation is not visualized. Aortic valve sclerosis/calcification is present, without any evidence of aortic stenosis. Aortic valve mean gradient measures 7.0 mmHg. Aortic valve peak gradient measures 12.7 mmHg. Aortic valve area, by VTI measures 2.79 cm. Pulmonic Valve: The pulmonic valve was normal in structure. Pulmonic valve regurgitation is not visualized. No evidence of pulmonic stenosis. Aorta: The aortic root is normal in size and structure. Venous: The inferior vena cava is normal in size with greater than 50% respiratory variability, suggesting right atrial pressure of 3 mmHg. IAS/Shunts: No atrial level shunt detected by color flow Doppler.  LEFT VENTRICLE PLAX 2D LVIDd:         4.70 cm   Diastology LVIDs:         2.90 cm   LV e' medial:    6.64 cm/s LV PW:         1.20 cm   LV E/e' medial:  18.5 LV IVS:        1.20 cm   LV e' lateral:   7.51 cm/s LVOT diam:     2.20 cm   LV E/e' lateral: 16.4 LV SV:         93 LV SV Index:    38 LVOT Area:     3.80 cm  RIGHT VENTRICLE RV Basal diam:  3.70 cm LEFT ATRIUM              Index        RIGHT ATRIUM           Index LA diam:        4.20 cm  1.70 cm/m   RA Area:     17.90 cm LA Vol (A2C):   102.0 ml 41.18 ml/m  RA Volume:   45.00 ml  18.17 ml/m LA Vol (A4C):   76.9 ml  31.05 ml/m LA Biplane Vol: 90.8 ml  36.66 ml/m  AORTIC VALVE AV Area (Vmax):    2.86 cm AV Area (Vmean):   2.72 cm AV Area (VTI):     2.79 cm AV Vmax:           178.00 cm/s AV Vmean:          116.000 cm/s AV VTI:            0.334 m AV Peak Grad:      12.7 mmHg AV Mean Grad:      7.0 mmHg LVOT Vmax:         134.00 cm/s LVOT Vmean:        82.900 cm/s LVOT VTI:          0.245 m LVOT/AV VTI ratio: 0.73  AORTA Ao Root diam: 3.10 cm Ao Asc diam:  3.00 cm MITRAL VALVE MV Area (  PHT): 3.23 cm     SHUNTS MV Decel Time: 235 msec     Systemic VTI:  0.24 m MV E velocity: 123.00 cm/s  Systemic Diam: 2.20 cm MV A velocity: 111.00 cm/s MV E/A ratio:  1.11 Jenkins Rouge MD Electronically signed by Jenkins Rouge MD Signature Date/Time: 07/24/2021/1:32:42 PM    Final    CT Angio Abd/Pel w/ and/or w/o  Result Date: 07/23/2021 CLINICAL DATA:  Hypertension, rectal bleeding EXAM: CTA ABDOMEN AND PELVIS WITHOUT AND WITH CONTRAST TECHNIQUE: Multidetector CT imaging of the abdomen and pelvis was performed using the standard protocol during bolus administration of intravenous contrast. Multiplanar reconstructed images and MIPs were obtained and reviewed to evaluate the vascular anatomy. RADIATION DOSE REDUCTION: This exam was performed according to the departmental dose-optimization program which includes automated exposure control, adjustment of the mA and/or kV according to patient size and/or use of iterative reconstruction technique. CONTRAST:  190mL OMNIPAQUE IOHEXOL 350 MG/ML SOLN COMPARISON:  None. FINDINGS: VASCULAR Aorta: Minimal calcified atheromatous plaque. No aneurysm, dissection, or stenosis. Celiac: Patent without evidence of  aneurysm, dissection, vasculitis or significant stenosis. SMA: Patent without evidence of aneurysm, dissection, vasculitis or significant stenosis. Renals: Single left, widely patent. Duplicated right, superior dominant, both patent. IMA: Patent without evidence of aneurysm, dissection, vasculitis or significant stenosis. Inflow: Minimal calcified plaque. No aneurysm, dissection, or stenosis. Proximal Outflow: Minimally atheromatous, patent Veins: Portal vein patent.  No venous pathology evident. Review of the MIP images confirms the above findings. NON-VASCULAR Lower chest: No pleural or pericardial effusion. Visualized lung bases clear. Hepatobiliary: No focal liver abnormality is seen. No gallstones, gallbladder wall thickening, or biliary dilatation. Pancreas: Unremarkable. No pancreatic ductal dilatation or surrounding inflammatory changes. Spleen: Normal in size.  1.6 cm mid splenic cyst. Adrenals/Urinary Tract: 2.5 cm right adrenal adenoma. No urolithiasis or hydronephrosis. Probable parapelvic cysts bilaterally. Urinary bladder is incompletely distended. Stomach/Bowel: Stomach is nondistended. Small bowel decompressed. Normal appendix. The colon is nondilated. Multiple descending and sigmoid diverticula without adjacent inflammatory change. No active extravasation. Lymphatic: No abdominal or pelvic adenopathy. Reproductive: Mild prostate enlargement with central coarse calcifications. Other: No ascites.  No free air. Musculoskeletal: Mild bilateral hip DJD. Spondylitic changes in the lower lumbar spine. IMPRESSION: 1. No evidence of active GI bleed. 2. Descending and sigmoid diverticulosis 3.  Aortic Atherosclerosis (ICD10-170.0). Electronically Signed   By: Lucrezia Europe M.D.   On: 07/23/2021 15:00        Scheduled Meds:  sodium chloride   Intravenous Once   Chlorhexidine Gluconate Cloth  6 each Topical Daily   cyanocobalamin  1,000 mcg Subcutaneous Daily   pantoprazole (PROTONIX) IV  40 mg  Intravenous Q12H   sodium chloride flush  3 mL Intravenous Q12H   Continuous Infusions:  dextrose 5 % and 0.45% NaCl 125 mL/hr at 07/25/21 0920     LOS: 1 day    Time spent: 40 minutes    Irine Seal, MD [facility]   To contact the attending provider between 7A-7P or the covering provider during after hours 7P-7A, please log into the web site [email or web address] and access using universal [place] password for that web site. If you do not have the password, please call the hospital operator.  07/25/2021, 9:49 AM

## 2021-07-25 NOTE — Progress Notes (Signed)
Subjective: Last bloody bowel movement was 12 PM yesterday afternoon.  He feels well, currently.  Objective: Vital signs in last 24 hours: Temp:  [97.3 F (36.3 C)-98.5 F (36.9 C)] 97.4 F (36.3 C) (01/22 0400) Pulse Rate:  [65-105] 102 (01/22 0400) Resp:  [12-21] 16 (01/22 0400) BP: (100-153)/(35-74) 111/55 (01/22 0400) SpO2:  [93 %-100 %] 97 % (01/22 0051) Last BM Date: 07/25/21  Intake/Output from previous day: 01/21 0701 - 01/22 0700 In: 4371.2 [I.V.:3776.5; Blood:594.7] Out: 2 [Stool:2] Intake/Output this shift: No intake/output data recorded.  General appearance: alert and no distress GI: soft, non-tender; bowel sounds normal; no masses,  no organomegaly  Lab Results: Recent Labs    07/23/21 1012 07/23/21 1342 07/24/21 0108 07/24/21 0746 07/24/21 1325 07/24/21 2112 07/25/21 0249  WBC 12.6*  --  12.6*  --   --   --   --   HGB 11.5*   < > 9.8*   < > 8.3* 7.0* 8.2*  HCT 35.0*   < > 29.9*   < > 24.8* 21.2* 24.3*  PLT 248  --  258  --   --   --   --    < > = values in this interval not displayed.   BMET Recent Labs    07/23/21 1012 07/24/21 0108 07/25/21 0249  NA 139 136 137  K 4.0 3.8 4.0  CL 110 105 110  CO2 23 25 22   GLUCOSE 173* 126* 158*  BUN 24* 15 18  CREATININE 1.04 0.86 0.82  CALCIUM 7.9* 8.1* 7.4*   LFT Recent Labs    07/24/21 0108  PROT 6.2*  ALBUMIN 3.3*  AST 18  ALT 19  ALKPHOS 36*  BILITOT 0.8   PT/INR Recent Labs    07/23/21 1009  LABPROT 13.9  INR 1.1   Hepatitis Panel No results for input(s): HEPBSAG, HCVAB, HEPAIGM, HEPBIGM in the last 72 hours. C-Diff No results for input(s): CDIFFTOX in the last 72 hours. Fecal Lactopherrin No results for input(s): FECLLACTOFRN in the last 72 hours.  Studies/Results: DG Chest Port 1 View  Result Date: 07/23/2021 CLINICAL DATA:  Syncope and rectal bleeding. EXAM: PORTABLE CHEST 1 VIEW COMPARISON:  Chest x-ray dated June 23, 2021. FINDINGS: The heart size and mediastinal  contours are within normal limits. Both lungs are clear. The visualized skeletal structures are unremarkable. IMPRESSION: No active disease. Electronically Signed   By: Titus Dubin M.D.   On: 07/23/2021 12:18   ECHOCARDIOGRAM COMPLETE  Result Date: 07/24/2021    ECHOCARDIOGRAM REPORT   Patient Name:   Todd Osborn Date of Exam: 07/24/2021 Medical Rec #:  [id number]     Height:       72.0 in Accession #:    [id number]    Weight:       285.0 lb Date of Birth:  [birth year]     BSA:          2.477 m Patient Age:    61 years      BP:           147/97 mmHg Patient Gender: M             HR:           58 bpm. Exam Location:  Inpatient Procedure: 2D Echo, Cardiac Doppler, Color Doppler and Intracardiac            Opacification Agent Indications:    Syncope  History:        Patient has no  prior history of Echocardiogram examinations.                 Diverticulitis. Blood in stool, "Passed out yesterday".  Sonographer:    Merrie Roof RDCS Referring Phys: [id number] [place]  1. Left ventricular ejection fraction, by estimation, is 55 to 60%. The left ventricle has normal function. The left ventricle has no regional wall motion abnormalities. There is mild left ventricular hypertrophy. Left ventricular diastolic parameters are consistent with Grade II diastolic dysfunction (pseudonormalization). Elevated left ventricular end-diastolic pressure.  2. Right ventricular systolic function is normal. The right ventricular size is normal.  3. Left atrial size was mildly dilated.  4. The mitral valve is abnormal. Trivial mitral valve regurgitation. No evidence of mitral stenosis.  5. The aortic valve is tricuspid. There is mild calcification of the aortic valve. There is mild thickening of the aortic valve. Aortic valve regurgitation is not visualized. Aortic valve sclerosis/calcification is present, without any evidence of aortic stenosis.  6. The inferior vena cava is normal in size with greater than 50%  respiratory variability, suggesting right atrial pressure of 3 mmHg. FINDINGS  Left Ventricle: Left ventricular ejection fraction, by estimation, is 55 to 60%. The left ventricle has normal function. The left ventricle has no regional wall motion abnormalities. Definity contrast agent was given IV to delineate the left ventricular  endocardial borders. The left ventricular internal cavity size was normal in size. There is mild left ventricular hypertrophy. Left ventricular diastolic parameters are consistent with Grade II diastolic dysfunction (pseudonormalization). Elevated left ventricular end-diastolic pressure. Right Ventricle: The right ventricular size is normal. No increase in right ventricular wall thickness. Right ventricular systolic function is normal. Left Atrium: Left atrial size was mildly dilated. Right Atrium: Right atrial size was normal in size. Pericardium: There is no evidence of pericardial effusion. Mitral Valve: The mitral valve is abnormal. There is mild thickening of the mitral valve leaflet(s). There is mild calcification of the mitral valve leaflet(s). Trivial mitral valve regurgitation. No evidence of mitral valve stenosis. Tricuspid Valve: The tricuspid valve is normal in structure. Tricuspid valve regurgitation is trivial. No evidence of tricuspid stenosis. Aortic Valve: The aortic valve is tricuspid. There is mild calcification of the aortic valve. There is mild thickening of the aortic valve. Aortic valve regurgitation is not visualized. Aortic valve sclerosis/calcification is present, without any evidence of aortic stenosis. Aortic valve mean gradient measures 7.0 mmHg. Aortic valve peak gradient measures 12.7 mmHg. Aortic valve area, by VTI measures 2.79 cm. Pulmonic Valve: The pulmonic valve was normal in structure. Pulmonic valve regurgitation is not visualized. No evidence of pulmonic stenosis. Aorta: The aortic root is normal in size and structure. Venous: The inferior vena  cava is normal in size with greater than 50% respiratory variability, suggesting right atrial pressure of 3 mmHg. IAS/Shunts: No atrial level shunt detected by color flow Doppler.  LEFT VENTRICLE PLAX 2D LVIDd:         4.70 cm   Diastology LVIDs:         2.90 cm   LV e' medial:    6.64 cm/s LV PW:         1.20 cm   LV E/e' medial:  18.5 LV IVS:        1.20 cm   LV e' lateral:   7.51 cm/s LVOT diam:     2.20 cm   LV E/e' lateral: 16.4 LV SV:         93  LV SV Index:   38 LVOT Area:     3.80 cm  RIGHT VENTRICLE RV Basal diam:  3.70 cm LEFT ATRIUM              Index        RIGHT ATRIUM           Index LA diam:        4.20 cm  1.70 cm/m   RA Area:     17.90 cm LA Vol (A2C):   102.0 ml 41.18 ml/m  RA Volume:   45.00 ml  18.17 ml/m LA Vol (A4C):   76.9 ml  31.05 ml/m LA Biplane Vol: 90.8 ml  36.66 ml/m  AORTIC VALVE AV Area (Vmax):    2.86 cm AV Area (Vmean):   2.72 cm AV Area (VTI):     2.79 cm AV Vmax:           178.00 cm/s AV Vmean:          116.000 cm/s AV VTI:            0.334 m AV Peak Grad:      12.7 mmHg AV Mean Grad:      7.0 mmHg LVOT Vmax:         134.00 cm/s LVOT Vmean:        82.900 cm/s LVOT VTI:          0.245 m LVOT/AV VTI ratio: 0.73  AORTA Ao Root diam: 3.10 cm Ao Asc diam:  3.00 cm MITRAL VALVE MV Area (PHT): 3.23 cm     SHUNTS MV Decel Time: 235 msec     Systemic VTI:  0.24 m MV E velocity: 123.00 cm/s  Systemic Diam: 2.20 cm MV A velocity: 111.00 cm/s MV E/A ratio:  1.11 Jenkins Rouge MD Electronically signed by Jenkins Rouge MD Signature Date/Time: 07/24/2021/1:32:42 PM    Final    CT Angio Abd/Pel w/ and/or w/o  Result Date: 07/23/2021 CLINICAL DATA:  Hypertension, rectal bleeding EXAM: CTA ABDOMEN AND PELVIS WITHOUT AND WITH CONTRAST TECHNIQUE: Multidetector CT imaging of the abdomen and pelvis was performed using the standard protocol during bolus administration of intravenous contrast. Multiplanar reconstructed images and MIPs were obtained and reviewed to evaluate the vascular  anatomy. RADIATION DOSE REDUCTION: This exam was performed according to the departmental dose-optimization program which includes automated exposure control, adjustment of the mA and/or kV according to patient size and/or use of iterative reconstruction technique. CONTRAST:  157mL OMNIPAQUE IOHEXOL 350 MG/ML SOLN COMPARISON:  None. FINDINGS: VASCULAR Aorta: Minimal calcified atheromatous plaque. No aneurysm, dissection, or stenosis. Celiac: Patent without evidence of aneurysm, dissection, vasculitis or significant stenosis. SMA: Patent without evidence of aneurysm, dissection, vasculitis or significant stenosis. Renals: Single left, widely patent. Duplicated right, superior dominant, both patent. IMA: Patent without evidence of aneurysm, dissection, vasculitis or significant stenosis. Inflow: Minimal calcified plaque. No aneurysm, dissection, or stenosis. Proximal Outflow: Minimally atheromatous, patent Veins: Portal vein patent.  No venous pathology evident. Review of the MIP images confirms the above findings. NON-VASCULAR Lower chest: No pleural or pericardial effusion. Visualized lung bases clear. Hepatobiliary: No focal liver abnormality is seen. No gallstones, gallbladder wall thickening, or biliary dilatation. Pancreas: Unremarkable. No pancreatic ductal dilatation or surrounding inflammatory changes. Spleen: Normal in size.  1.6 cm mid splenic cyst. Adrenals/Urinary Tract: 2.5 cm right adrenal adenoma. No urolithiasis or hydronephrosis. Probable parapelvic cysts bilaterally. Urinary bladder is incompletely distended. Stomach/Bowel: Stomach is nondistended. Small bowel decompressed. Normal appendix. The colon  is nondilated. Multiple descending and sigmoid diverticula without adjacent inflammatory change. No active extravasation. Lymphatic: No abdominal or pelvic adenopathy. Reproductive: Mild prostate enlargement with central coarse calcifications. Other: No ascites.  No free air. Musculoskeletal: Mild  bilateral hip DJD. Spondylitic changes in the lower lumbar spine. IMPRESSION: 1. No evidence of active GI bleed. 2. Descending and sigmoid diverticulosis 3.  Aortic Atherosclerosis (ICD10-170.0). Electronically Signed   By: Lucrezia Europe M.D.   On: 07/23/2021 15:00    Medications: Scheduled:  sodium chloride   Intravenous Once   sodium chloride   Intravenous Once   acetaminophen  650 mg Oral Once   Chlorhexidine Gluconate Cloth  6 each Topical Daily   cyanocobalamin  1,000 mcg Subcutaneous Daily   diphenhydrAMINE  25 mg Oral Once   pantoprazole (PROTONIX) IV  40 mg Intravenous Q12H   sodium chloride flush  3 mL Intravenous Q12H   Continuous:  dextrose 5 % and 0.45% NaCl 125 mL/hr at 07/24/21 1354    Assessment/Plan: 1) Diverticular bleed. 2) Anemia - improved with blood transfusion.   The patient continues to have episodic hematochezia.  He remains stable, but he does feel weak and dizzy every time he has a bloody bowel movement.  His HGB improved with the one unit of PRBC.  The best imaging modality is a CTA with his type of bleeding, however, it is still difficult to localize bleeding with imaging.  The patient understands the unpredictable nature of diverticular bleeds.  Plan: 1) Continue to monitor HGB. 2) Transfuse as necessary.  LOS: 1 day   Todd Osborn D 07/25/2021, 8:31 AM

## 2021-07-26 ENCOUNTER — Inpatient Hospital Stay (HOSPITAL_COMMUNITY): Payer: BC Managed Care – PPO

## 2021-07-26 DIAGNOSIS — D72829 Elevated white blood cell count, unspecified: Secondary | ICD-10-CM | POA: Clinically undetermined

## 2021-07-26 LAB — CBC WITH DIFFERENTIAL/PLATELET
Abs Immature Granulocytes: 0.31 10*3/uL — ABNORMAL HIGH (ref 0.00–0.07)
Basophils Absolute: 0.1 10*3/uL (ref 0.0–0.1)
Basophils Relative: 1 %
Eosinophils Absolute: 0.2 10*3/uL (ref 0.0–0.5)
Eosinophils Relative: 1 %
HCT: 25.2 % — ABNORMAL LOW (ref 39.0–52.0)
Hemoglobin: 8.8 g/dL — ABNORMAL LOW (ref 13.0–17.0)
Immature Granulocytes: 2 %
Lymphocytes Relative: 18 %
Lymphs Abs: 3.2 10*3/uL (ref 0.7–4.0)
MCH: 30 pg (ref 26.0–34.0)
MCHC: 34.9 g/dL (ref 30.0–36.0)
MCV: 86 fL (ref 80.0–100.0)
Monocytes Absolute: 1.7 10*3/uL — ABNORMAL HIGH (ref 0.1–1.0)
Monocytes Relative: 9 %
Neutro Abs: 12.1 10*3/uL — ABNORMAL HIGH (ref 1.7–7.7)
Neutrophils Relative %: 69 %
Platelets: 188 10*3/uL (ref 150–400)
RBC: 2.93 MIL/uL — ABNORMAL LOW (ref 4.22–5.81)
RDW: 14.2 % (ref 11.5–15.5)
WBC: 17.6 10*3/uL — ABNORMAL HIGH (ref 4.0–10.5)
nRBC: 0.6 % — ABNORMAL HIGH (ref 0.0–0.2)

## 2021-07-26 LAB — MAGNESIUM: Magnesium: 1.8 mg/dL (ref 1.7–2.4)

## 2021-07-26 LAB — GLUCOSE, CAPILLARY: Glucose-Capillary: 126 mg/dL — ABNORMAL HIGH (ref 70–99)

## 2021-07-26 LAB — URINALYSIS, ROUTINE W REFLEX MICROSCOPIC
Bilirubin Urine: NEGATIVE
Glucose, UA: NEGATIVE mg/dL
Hgb urine dipstick: NEGATIVE
Ketones, ur: NEGATIVE mg/dL
Leukocytes,Ua: NEGATIVE
Nitrite: NEGATIVE
Protein, ur: NEGATIVE mg/dL
Specific Gravity, Urine: 1.023 (ref 1.005–1.030)
pH: 5 (ref 5.0–8.0)

## 2021-07-26 LAB — CBC
HCT: 22.9 % — ABNORMAL LOW (ref 39.0–52.0)
Hemoglobin: 7.9 g/dL — ABNORMAL LOW (ref 13.0–17.0)
MCH: 30.3 pg (ref 26.0–34.0)
MCHC: 34.5 g/dL (ref 30.0–36.0)
MCV: 87.7 fL (ref 80.0–100.0)
Platelets: 191 10*3/uL (ref 150–400)
RBC: 2.61 MIL/uL — ABNORMAL LOW (ref 4.22–5.81)
RDW: 14.5 % (ref 11.5–15.5)
WBC: 15.8 10*3/uL — ABNORMAL HIGH (ref 4.0–10.5)
nRBC: 0.4 % — ABNORMAL HIGH (ref 0.0–0.2)

## 2021-07-26 LAB — BASIC METABOLIC PANEL
Anion gap: 5 (ref 5–15)
BUN: 15 mg/dL (ref 8–23)
CO2: 23 mmol/L (ref 22–32)
Calcium: 7.5 mg/dL — ABNORMAL LOW (ref 8.9–10.3)
Chloride: 107 mmol/L (ref 98–111)
Creatinine, Ser: 0.95 mg/dL (ref 0.61–1.24)
GFR, Estimated: >60 mL/min (ref >60)
Glucose, Bld: 135 mg/dL — ABNORMAL HIGH (ref 70–99)
Potassium: 3.5 mmol/L (ref 3.5–5.1)
Sodium: 135 mmol/L (ref 135–145)

## 2021-07-26 LAB — HEMOGLOBIN A1C
Hgb A1c MFr Bld: 6.2 % — ABNORMAL HIGH (ref 4.8–5.6)
Mean Plasma Glucose: 131 mg/dL

## 2021-07-26 MED ORDER — POTASSIUM CHLORIDE CRYS ER 10 MEQ PO TBCR
40.0000 meq | EXTENDED_RELEASE_TABLET | Freq: Once | ORAL | Status: AC
  Administered 2021-07-26: 40 meq via ORAL
  Filled 2021-07-26: qty 4

## 2021-07-26 MED ORDER — ACETAMINOPHEN 325 MG PO TABS
ORAL_TABLET | ORAL | Status: AC
  Filled 2021-07-26: qty 2

## 2021-07-26 MED ORDER — ACETAMINOPHEN 325 MG PO TABS
650.0000 mg | ORAL_TABLET | Freq: Once | ORAL | Status: AC | PRN
  Administered 2021-07-26: 650 mg via ORAL

## 2021-07-26 NOTE — Progress Notes (Addendum)
[facility] Progress Note  CC: Painless hematochezia  Subjective: He reported eating a large amount of pudding and potato soup and drank a cup of coffee yesterday afternoon and around 5:30 PM he passed a large amount of dark red blood in the commode.  He felt dizzy and lightheaded at that time.  He was transfused 2 units of PRBCs overnight and he feels well this morning. No further dizziness.  No further hematochezia or BMs since 5:30 PM yesterday.  He has passed gas per the rectum this morning.  No abdominal pain.  He had mild nausea after drinking contrast for the CTA yesterday. No vomiting. He remains NPO.  No chest pain, palpitations or shortness of breath.   Objective:  Vital signs in last 24 hours: Temp:  [98 F (36.7 C)-99.1 F (37.3 C)] 98.3 F (36.8 C) (01/23 0555) Pulse Rate:  [76-137] 90 (01/23 0800) Resp:  [0-34] 29 (01/23 0800) BP: (119-176)/(57-174) 123/96 (01/23 0800) SpO2:  [94 %-100 %] 97 % (01/23 0800) Weight:  [132.1 kg] 132.1 kg (01/23 0500) Last BM Date: 07/25/21 General:   Alert 61 year old male in no acute distress. Heart: Regular rate and rhythm, very soft systolic murmur. Pulm: Breath sounds clear throughout. Abdomen: Obese abdomen soft, nontender.  Positive bowel sounds to all 4 quadrants. Extremities:  Without edema. Neurologic:  Alert and  oriented x 4. Grossly normal neurologically. Psych:  Alert and cooperative. Normal mood and affect.  Lab Results: Recent Labs    07/23/21 1012 07/23/21 1342 07/24/21 0108 07/24/21 0746 07/25/21 1638 07/25/21 1730 07/26/21 0741  WBC 12.6*  --  12.6*  --   --   --  17.6*  HGB 11.5*   < > 9.8*   < > 8.6* 7.6* 8.8*  HCT 35.0*   < > 29.9*   < > 25.3* 23.3* 25.2*  PLT 248  --  258  --   --   --  188   < > = values in this interval not displayed.   BMET Recent Labs    07/24/21 0108 07/25/21 0249 07/26/21 0741  NA 136 137 135  K 3.8 4.0 3.5  CL 105 110 107  CO2 25 22 23   GLUCOSE 126* 158*  135*  BUN 15 18 15   CREATININE 0.86 0.82 0.95  CALCIUM 8.1* 7.4* 7.5*   LFT Recent Labs    07/24/21 0108  PROT 6.2*  ALBUMIN 3.3*  AST 18  ALT 19  ALKPHOS 36*  BILITOT 0.8   PT/INR Recent Labs    07/23/21 1009  LABPROT 13.9  INR 1.1   Assessment / Plan: 1) 61 year old male history of diverticulosis admitted to the hospital 07/23/2021 with painless hematochezia, diverticular bleed suspected. Admission Hg level 11.5 (baseline Hg 13.8).  Abdominal/pelvic CTA 07/23/2021 was negative for active GI bleeding. Hg dropped to 7.0 -> Transfused 2 units or PRBCs on 1/21 -> Hg 8.2. He  passed a large amount of darker red blood in the commode with associated dizziness 07/2020  -> Hg 7.5. Repeat abd/pelvic CTA 1/22 negative for active GI bleed. Transfused 4 units or PRBCs 1/22 - 1/23 -> post transfusion Hg 8.8.  No further hematochezia since yesterday at 5:30 PM.  He feels well this morning.  No further dizziness.  He is hemodynamically stable.   -Clear liquid -Continue to monitor the patient closely for active GI bleed -If active GI bleeding recurs, consider tagged red blood cell scan -Monitor H&H every 6 hours -Transfuse for hemoglobin <  7.5 -No plans for endoscopic evaluation at this time -Continue PPI 1 mg IV twice daily -Zofran 4 mg p.o. or IV every 6 hours as needed -Await further recommendations per Dr. 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  He is afebrile. -Chest x-ray and urine culture ordered by the hospitalist    LOS: 2 days   Noralyn Pick  07/26/2021, 8:28 AM  _______________________________________________________________________________________________________________________________________  Velora Heckler GI MD note:  I personally examined the patient, reviewed the data and agree with the assessment and plan  described above.  I provided a substantive portion of the care of this patient (personally provided more than half of the total time dedicated to the treatment of this patient.)  He's been in hospital for 3 days with painless hematochezia. Has required 6 units PRBC. Two CT angiograms have failed to localize the exact site of bleeding but we presume it to be colonic, diverticular.  He's had zero bleeding in about 24 hours now.  I am going to allow regular diet. IF he does well over night and tomorrow mornign then he should be safe for d/c.  If he has obvious recurrent bleeding then he needs STAT nuc med bleeding scan.  CBC in AM.   Owens Loffler, MD [facility] Pager [phone]

## 2021-07-26 NOTE — Progress Notes (Addendum)
PROGRESS NOTE    JHETT FRETWELL  KVQ:[id number] DOB: 1961-05-25 DOA: 07/23/2021 PCP: Azzie Glatter, FNP    Chief Complaint  Patient presents with   GI Bleeding    Brief Narrative: Patient 61 year old gentleman history of hypertension, extensive left-sided diverticulosis presented to the ED with 3-4 episodes of rectal bleeding/bloody bowel movements with some associated lightheadedness, dizziness and syncopal episode.  Patient seen in the ED hemoglobin noted to be stable.  CT angiogram abdomen and pelvis done was negative.  Patient admitted serial CBCs ordered.  Patient placed on IV fluids.  GI consulted who assessed the patient felt likely diverticular bleed and recommended if patient develops any further episodes of recurrent hematochezia to get a stat RBC tagged scan to localize site of bleeding and consult with IR.   Assessment & Plan:   Principal Problem:   GIB (gastrointestinal bleeding) Active Problems:   Acute blood loss anemia   Syncope   Elevated troponin   Vitamin B12 deficiency   Leukocytosis  #1 GI bleed concern for probable diverticular bleed -Patient presented with 3-4 episodes of bloody bowel movements/rectal bleeding, painless hematochezia. -Patient with prior history of extensive left-sided diverticular disease per colonoscopy of 2018.  Patient denies any significant daily NSAID use. -Patient with no abdominal pain. -CT angiogram abdomen and pelvis done on admission, negative for any active extravasation. -Patient seen in consultation by GI who feel patient may have a diverticular bleed recommending stat RBC tagged scan to localize site of bleeding if patient with recurrent hematochezia with IR consult for embolization on initial admission.. -Hemoglobin currently at 8.8 from 7.5 from 8.2 from 7.0 from 8.3 from 9.7 this morning from 11.5 on admission.  Hemoglobin noted at 13.2 02/20/2017. -Status post transfusion 6 units packed red blood cells during this  hospitalization. -Patient noted to have been having intermittent bloody bowel movements throughout the hospitalization.   -Noted to have significant bloody bowel movement yesterday evening with hemoglobin going as low as 7.6 requiring transfusions.  -Anemia panel consistent with anemia of chronic disease with iron level of 58, TIBC of 232 folate of 15.1, vitamin B12 of 148. -Patient underwent second CT angiogram abdomen and pelvis (07/25/2021) which was negative for any active bleed. -Was made n.p.o. overnight with acute GI bleed, and will be placed on clear liquids today per GI. -Continue serial CBCs, gentle hydration, supportive care. -GI following and appreciate input and recommendations.  2.  Acute blood loss anemia/vitamin B12 deficiency -Secondary to problem #1. -Serial CBCs. -Transfusion threshold hemoglobin < 8.  -Patient noted to have a vitamin B12 deficiency with vitamin B12 levels at 148. -Patient started on vitamin B12 supplementation with 1000 MCG's subcu daily x5 days, then weekly x1 month, then monthly. -Could potentially discharge on oral vitamin B12 supplementation. -Outpatient follow-up. -Follow.  3.  Syncope -Likely secondary to problem #1. -No further episodes. -Follow-up.  4.  Elevated troponin -Patient with mildly elevated troponin likely demand ischemia secondary to problem #1. -Patient denies any chest pain. -EKG with nonspecific intraventricular conduction delay. -2D echo with a EF of 55 to 60%, NWMA, grade 2 diastolic dysfunction, mildly dilated left atrial size, normal right ventricular systolic function, trivial MVR, no evidence of mitral stenosis.   -No further cardiac work-up needed at this time.   5.  Hyperglycemia -No history of diabetes. -Hemoglobin A1c 5.6 (12/19/2017) -Repeat hemoglobin A1c pending.  6.  Leukocytosis -Likely reactive leukocytosis secondary to problem #1. -Patient afebrile, denies any shortness of breath, denies any  dysuria. -  Check a chest x-ray, UA with cultures and sensitivities. -No need for antibiotics at this time. -Follow-up.   DVT prophylaxis: SCDs Code Status: Full Family Communication: Updated patient.  No family at bedside. Disposition:   Status is: Observation  The patient remains OBS appropriate and will d/c before 2 midnights.      Consultants:  Gastroenterology: Dr. Silverio Decamp 07/23/2021  Procedures:  CT angiogram abdomen and pelvis 07/23/2021, 07/25/2021 Chest x-ray 07/23/2021 2D echo 07/24/2021  Transfusion 2 unit packed red blood cells 07/24/2021 Transfusion 3 units packed red blood cells  07/25/2021 Transfused 1 unit packed red blood cells 07/26/2021     Antimicrobials:  None   Subjective: Patient laying in bed.  Noted to have bloody bowel movements yesterday evening with borderline blood pressure, tachycardia, tachypnea requiring fluid bolus and transfusion of 2 more units packed red blood cells as hemoglobin had dropped to 7.6.   -Patient laying in bed states feels much better this morning.  No chest pain.  No shortness of breath.  No abdominal pain.  No further bleeding this morning.  No dysuria.  Objective: Vitals:   07/26/21 0555 07/26/21 0600 07/26/21 0700 07/26/21 0800  BP: (!) 145/69  (!) 149/113 (!) 123/96  Pulse: 81 76 86 90  Resp: 18 16 (!) 23 (!) 29  Temp: 98.3 F (36.8 C)     TempSrc: Oral     SpO2: 97% 97% 98% 97%  Weight:      Height:        Intake/Output Summary (Last 24 hours) at 07/26/2021 0827 Last data filed at 07/26/2021 0540 Gross per 24 hour  Intake 5163.09 ml  Output 1725 ml  Net 3438.09 ml    Filed Weights   07/23/21 0959 07/26/21 0500  Weight: 129.3 kg 132.1 kg    Examination:  General exam: NAD. Respiratory system: Lungs clear to auscultation bilaterally.  No wheezes, no crackles, no rhonchi.  Normal respiratory effort.  Speaking in full sentences.   Cardiovascular system: RRR no murmurs rubs or gallops.  No JVD.  Trace to 1+  bilateral lower extremity edema.  Gastrointestinal system: Abdomen is obese, soft, mildly distended, positive bowel sounds.  Nontender to palpation.  No rebound.  No guarding.  Central nervous system: Alert and oriented. No focal neurological deficits. Extremities: Symmetric 5 x 5 power. Skin: No rashes, lesions or ulcers Psychiatry: Judgement and insight appear normal. Mood & affect appropriate.     Data Reviewed: I have personally reviewed following labs and imaging studies  CBC: Recent Labs  Lab 07/23/21 1012 07/23/21 1342 07/24/21 0108 07/24/21 0746 07/25/21 0249 07/25/21 0827 07/25/21 1638 07/25/21 1730 07/26/21 0741  WBC 12.6*  --  12.6*  --   --   --   --   --  17.6*  NEUTROABS  --   --   --   --   --   --   --   --  12.1*  HGB 11.5*   < > 9.8*   < > 8.2* 7.5* 8.6* 7.6* 8.8*  HCT 35.0*   < > 29.9*   < > 24.3* 22.2* 25.3* 23.3* 25.2*  MCV 89.3  --  88.5  --   --   --   --   --  86.0  PLT 248  --  258  --   --   --   --   --  188   < > = values in this interval not displayed.     Basic Metabolic  Panel: Recent Labs  Lab 07/23/21 1012 07/24/21 0108 07/25/21 0249 07/26/21 0741  NA 139 136 137 135  K 4.0 3.8 4.0 3.5  CL 110 105 110 107  CO2 23 25 22 23   GLUCOSE 173* 126* 158* 135*  BUN 24* 15 18 15   CREATININE 1.04 0.86 0.82 0.95  CALCIUM 7.9* 8.1* 7.4* 7.5*  MG  --   --  1.8 1.8     GFR: Estimated Creatinine Clearance: 114.8 mL/min (by C-G formula based on SCr of 0.95 mg/dL).  Liver Function Tests: Recent Labs  Lab 07/23/21 1012 07/24/21 0108  AST 18 18  ALT 21 19  ALKPHOS 41 36*  BILITOT 0.2* 0.8  PROT 6.4* 6.2*  ALBUMIN 3.4* 3.3*     CBG: Recent Labs  Lab 07/25/21 0745 07/25/21 1625 07/26/21 0748  GLUCAP 178* 169* 126*      Recent Results (from the past 240 hour(s))  Resp Panel by RT-PCR (Flu A&B, Covid) Nasopharyngeal Swab     Status: None   Collection Time: 07/23/21 10:59 AM   Specimen: Nasopharyngeal Swab; Nasopharyngeal(NP)  swabs in vial transport medium  Result Value Ref Range Status   SARS Coronavirus 2 by RT PCR NEGATIVE NEGATIVE Final    Comment: (NOTE) SARS-CoV-2 target nucleic acids are NOT DETECTED.  The SARS-CoV-2 RNA is generally detectable in upper respiratory specimens during the acute phase of infection. The lowest concentration of SARS-CoV-2 viral copies this assay can detect is 138 copies/mL. A negative result does not preclude SARS-Cov-2 infection and should not be used as the sole basis for treatment or other patient management decisions. A negative result may occur with  improper specimen collection/handling, submission of specimen other than nasopharyngeal swab, presence of viral mutation(s) within the areas targeted by this assay, and inadequate number of viral copies(<138 copies/mL). A negative result must be combined with clinical observations, patient history, and epidemiological information. The expected result is Negative.  Fact Sheet for Patients:  EntrepreneurPulse.com.au  Fact Sheet for Healthcare Providers:  IncredibleEmployment.be  This test is no t yet approved or cleared by the [place] FDA and  has been authorized for detection and/or diagnosis of SARS-CoV-2 by FDA under an Emergency Use Authorization (EUA). This EUA will remain  in effect (meaning this test can be used) for the duration of the COVID-19 declaration under Section 564(b)(1) of the Act, 21 U.S.C.section 360bbb-3(b)(1), unless the authorization is terminated  or revoked sooner.       Influenza A by PCR NEGATIVE NEGATIVE Final   Influenza B by PCR NEGATIVE NEGATIVE Final    Comment: (NOTE) The Xpert Xpress SARS-CoV-2/FLU/RSV plus assay is intended as an aid in the diagnosis of influenza from Nasopharyngeal swab specimens and should not be used as a sole basis for treatment. Nasal washings and aspirates are unacceptable for Xpert Xpress  SARS-CoV-2/FLU/RSV testing.  Fact Sheet for Patients: EntrepreneurPulse.com.au  Fact Sheet for Healthcare Providers: IncredibleEmployment.be  This test is not yet approved or cleared by the [place] FDA and has been authorized for detection and/or diagnosis of SARS-CoV-2 by FDA under an Emergency Use Authorization (EUA). This EUA will remain in effect (meaning this test can be used) for the duration of the COVID-19 declaration under Section 564(b)(1) of the Act, 21 U.S.C. section 360bbb-3(b)(1), unless the authorization is terminated or revoked.  Performed at [facility], [place] [address] [place], [place] [address]   MRSA Next Gen by PCR, Nasal     Status: None   Collection  Time: 07/24/21  3:58 PM   Specimen: Nasal Mucosa; Nasal Swab  Result Value Ref Range Status   MRSA by PCR Next Gen NOT DETECTED NOT DETECTED Final    Comment: (NOTE) The GeneXpert MRSA Assay (FDA approved for NASAL specimens only), is one component of a comprehensive MRSA colonization surveillance program. It is not intended to diagnose MRSA infection nor to guide or monitor treatment for MRSA infections. Test performance is not FDA approved in patients less than 72 years old. Performed at [facility], [place] [address] [place], [place] [address]           Radiology Studies: ECHOCARDIOGRAM COMPLETE  Result Date: 07/24/2021    ECHOCARDIOGRAM REPORT   Patient Name:   TARYLL REICHENBERGER Date of Exam: 07/24/2021 Medical Rec #:  [id number]     Height:       72.0 in Accession #:    [id number]    Weight:       285.0 lb Date of Birth:  Jul 29, 1960     BSA:          2.477 m Patient Age:    34 years      BP:           147/97 mmHg Patient Gender: M             HR:           58 bpm. Exam Location:  Inpatient Procedure: 2D Echo, Cardiac Doppler, Color Doppler and Intracardiac            Opacification Agent Indications:    Syncope  History:         Patient has no prior history of Echocardiogram examinations.                 Diverticulitis. Blood in stool, "Passed out yesterday".  Sonographer:    Merrie Roof RDCS Referring Phys: [id number] [place]  1. Left ventricular ejection fraction, by estimation, is 55 to 60%. The left ventricle has normal function. The left ventricle has no regional wall motion abnormalities. There is mild left ventricular hypertrophy. Left ventricular diastolic parameters are consistent with Grade II diastolic dysfunction (pseudonormalization). Elevated left ventricular end-diastolic pressure.  2. Right ventricular systolic function is normal. The right ventricular size is normal.  3. Left atrial size was mildly dilated.  4. The mitral valve is abnormal. Trivial mitral valve regurgitation. No evidence of mitral stenosis.  5. The aortic valve is tricuspid. There is mild calcification of the aortic valve. There is mild thickening of the aortic valve. Aortic valve regurgitation is not visualized. Aortic valve sclerosis/calcification is present, without any evidence of aortic stenosis.  6. The inferior vena cava is normal in size with greater than 50% respiratory variability, suggesting right atrial pressure of 3 mmHg. FINDINGS  Left Ventricle: Left ventricular ejection fraction, by estimation, is 55 to 60%. The left ventricle has normal function. The left ventricle has no regional wall motion abnormalities. Definity contrast agent was given IV to delineate the left ventricular  endocardial borders. The left ventricular internal cavity size was normal in size. There is mild left ventricular hypertrophy. Left ventricular diastolic parameters are consistent with Grade II diastolic dysfunction (pseudonormalization). Elevated left ventricular end-diastolic pressure. Right Ventricle: The right ventricular size is normal. No increase in right ventricular wall thickness. Right ventricular systolic function is normal. Left Atrium:  Left atrial size was mildly dilated. Right Atrium: Right atrial size was normal in size. Pericardium: There is  no evidence of pericardial effusion. Mitral Valve: The mitral valve is abnormal. There is mild thickening of the mitral valve leaflet(s). There is mild calcification of the mitral valve leaflet(s). Trivial mitral valve regurgitation. No evidence of mitral valve stenosis. Tricuspid Valve: The tricuspid valve is normal in structure. Tricuspid valve regurgitation is trivial. No evidence of tricuspid stenosis. Aortic Valve: The aortic valve is tricuspid. There is mild calcification of the aortic valve. There is mild thickening of the aortic valve. Aortic valve regurgitation is not visualized. Aortic valve sclerosis/calcification is present, without any evidence of aortic stenosis. Aortic valve mean gradient measures 7.0 mmHg. Aortic valve peak gradient measures 12.7 mmHg. Aortic valve area, by VTI measures 2.79 cm. Pulmonic Valve: The pulmonic valve was normal in structure. Pulmonic valve regurgitation is not visualized. No evidence of pulmonic stenosis. Aorta: The aortic root is normal in size and structure. Venous: The inferior vena cava is normal in size with greater than 50% respiratory variability, suggesting right atrial pressure of 3 mmHg. IAS/Shunts: No atrial level shunt detected by color flow Doppler.  LEFT VENTRICLE PLAX 2D LVIDd:         4.70 cm   Diastology LVIDs:         2.90 cm   LV e' medial:    6.64 cm/s LV PW:         1.20 cm   LV E/e' medial:  18.5 LV IVS:        1.20 cm   LV e' lateral:   7.51 cm/s LVOT diam:     2.20 cm   LV E/e' lateral: 16.4 LV SV:         93 LV SV Index:   38 LVOT Area:     3.80 cm  RIGHT VENTRICLE RV Basal diam:  3.70 cm LEFT ATRIUM              Index        RIGHT ATRIUM           Index LA diam:        4.20 cm  1.70 cm/m   RA Area:     17.90 cm LA Vol (A2C):   102.0 ml 41.18 ml/m  RA Volume:   45.00 ml  18.17 ml/m LA Vol (A4C):   76.9 ml  31.05 ml/m LA Biplane  Vol: 90.8 ml  36.66 ml/m  AORTIC VALVE AV Area (Vmax):    2.86 cm AV Area (Vmean):   2.72 cm AV Area (VTI):     2.79 cm AV Vmax:           178.00 cm/s AV Vmean:          116.000 cm/s AV VTI:            0.334 m AV Peak Grad:      12.7 mmHg AV Mean Grad:      7.0 mmHg LVOT Vmax:         134.00 cm/s LVOT Vmean:        82.900 cm/s LVOT VTI:          0.245 m LVOT/AV VTI ratio: 0.73  AORTA Ao Root diam: 3.10 cm Ao Asc diam:  3.00 cm MITRAL VALVE MV Area (PHT): 3.23 cm     SHUNTS MV Decel Time: 235 msec     Systemic VTI:  0.24 m MV E velocity: 123.00 cm/s  Systemic Diam: 2.20 cm MV A velocity: 111.00 cm/s MV E/A ratio:  1.11 Jenkins Rouge MD Electronically  signed by Jenkins Rouge MD Signature Date/Time: 07/24/2021/1:32:42 PM    Final    CT Angio Abd/Pel w/ and/or w/o  Result Date: 07/25/2021 CLINICAL DATA:  Intermittent acute hematochezia with large liquid bloody bowel movement earlier this evening. EXAM: CTA ABDOMEN AND PELVIS WITHOUT AND WITH CONTRAST TECHNIQUE: Multidetector CT imaging of the abdomen and pelvis was performed using the standard protocol prior to and during bolus administration of intravenous contrast. Arterial and venous phases were performed after contrast administration with multiplanar reconstructed images and MIPs obtained and reviewed to evaluate the vascular anatomy. RADIATION DOSE REDUCTION: This exam was performed according to the departmental dose-optimization program which includes automated exposure control, adjustment of the mA and/or kV according to patient size and/or use of iterative reconstruction technique. CONTRAST:  164mL OMNIPAQUE IOHEXOL 350 MG/ML SOLN COMPARISON:  Prior CTA on 07/23/2021 FINDINGS: VASCULAR Aorta: Stable mild atherosclerosis of the abdominal aorta without evidence of aneurysm or dissection. Celiac: Normally patent. Normally patent branch vessels and branch vessel anatomy. No acute bleeding source identified in the celiac distribution on arterial or delayed  phases. SMA: Normally patent. Visualized branch vessels are normally patent. No acute bleeding source identified in the SMA distribution on arterial or delayed phases. Renals: Two separate right renal arteries and a single left renal artery demonstrate normal patency. IMA: Normally patent. No acute bleeding source identified in the IMA territory. Inflow: Bilateral iliac arteries demonstrate normal patency. Proximal Outflow: Bilateral common femoral arteries and femoral bifurcations are normally patent. Veins: Venous phase imaging demonstrates normal patency of venous structures including the IVC, hepatic veins, renal veins, iliac veins, common femoral veins, mesenteric veins, splenic vein and portal vein. Review of the MIP images confirms the above findings. NON-VASCULAR Lower chest: No acute abnormality. Hepatobiliary: Stable hepatic steatosis. No hepatic masses or biliary dilatation. Vicarious excretion of contrast in the gallbladder. Pancreas: Unremarkable. No pancreatic ductal dilatation or surrounding inflammatory changes. Spleen: Normal size.  Stable cystic region in the mid spleen. Adrenals/Urinary Tract: Normal adrenal glands. No renal masses or hydronephrosis. Stable parapelvic renal cysts bilaterally. The bladder is unremarkable. Stomach/Bowel: Bowel shows no evidence obstruction, ileus or inflammation. Stable diverticulosis of the descending and sigmoid colon without evidence of acute diverticulitis. No active contrast extravasation into bowel lumen on arterial or venous phases of imaging. Lymphatic: No enlarged abdominal or pelvic lymph nodes identified. Reproductive: Prostate is unremarkable. Other: No hernias identified. No ascites or abnormal fluid collections. Musculoskeletal: No acute or significant osseous findings. IMPRESSION: 1. No active bleeding source identified by CTA on arterial or venous phases of imaging into the gastrointestinal tract. 2. Stable diverticulosis of descending and sigmoid  colon without diverticulitis or other inflammatory process. 3. Stable hepatic steatosis. Electronically Signed   By: Aletta Edouard M.D.   On: 07/25/2021 18:45        Scheduled Meds:  sodium chloride   Intravenous Once   Chlorhexidine Gluconate Cloth  6 each Topical Daily   cyanocobalamin  1,000 mcg Subcutaneous Daily   pantoprazole (PROTONIX) IV  40 mg Intravenous Q12H   potassium chloride  40 mEq Oral Once   sodium chloride flush  3 mL Intravenous Q12H   Continuous Infusions:  dextrose 5 % and 0.45% NaCl 75 mL/hr at 07/26/21 0403     LOS: 2 days    Time spent: 40 minutes    Irine Seal, MD [facility]   To contact the attending provider between 7A-7P or the covering provider during after hours 7P-7A, please log into the web site  [email or web address] and access using universal [place] password for that web site. If you do not have the password, please call the hospital operator.  07/26/2021, 8:27 AM

## 2021-07-27 ENCOUNTER — Telehealth: Payer: Self-pay

## 2021-07-27 ENCOUNTER — Other Ambulatory Visit: Payer: Self-pay

## 2021-07-27 DIAGNOSIS — K922 Gastrointestinal hemorrhage, unspecified: Secondary | ICD-10-CM

## 2021-07-27 DIAGNOSIS — D72829 Elevated white blood cell count, unspecified: Secondary | ICD-10-CM

## 2021-07-27 LAB — TYPE AND SCREEN
ABO/RH(D): B POS
Antibody Screen: NEGATIVE
Unit division: 0
Unit division: 0
Unit division: 0
Unit division: 0
Unit division: 0
Unit division: 0
Unit division: 0
Unit division: 0

## 2021-07-27 LAB — CBC
HCT: 22.1 % — ABNORMAL LOW (ref 39.0–52.0)
Hemoglobin: 7.4 g/dL — ABNORMAL LOW (ref 13.0–17.0)
MCH: 30.1 pg (ref 26.0–34.0)
MCHC: 33.5 g/dL (ref 30.0–36.0)
MCV: 89.8 fL (ref 80.0–100.0)
Platelets: 169 10*3/uL (ref 150–400)
RBC: 2.46 MIL/uL — ABNORMAL LOW (ref 4.22–5.81)
RDW: 14.7 % (ref 11.5–15.5)
WBC: 11.2 10*3/uL — ABNORMAL HIGH (ref 4.0–10.5)
nRBC: 0.7 % — ABNORMAL HIGH (ref 0.0–0.2)

## 2021-07-27 LAB — BPAM RBC
Blood Product Expiration Date: 202302102359
Blood Product Expiration Date: 202302102359
Blood Product Expiration Date: 202302132359
Blood Product Expiration Date: 202302132359
Blood Product Expiration Date: 202302152359
Blood Product Expiration Date: 202302152359
Blood Product Expiration Date: 202302202359
Blood Product Expiration Date: 202302212359
ISSUE DATE / TIME: 202301211632
ISSUE DATE / TIME: 202301212244
ISSUE DATE / TIME: 202301220926
ISSUE DATE / TIME: 202301221231
ISSUE DATE / TIME: 202301222318
ISSUE DATE / TIME: 202301230244
ISSUE DATE / TIME: 202301240131
ISSUE DATE / TIME: 202301240902
Unit Type and Rh: 7300
Unit Type and Rh: 7300
Unit Type and Rh: 7300
Unit Type and Rh: 7300
Unit Type and Rh: 7300
Unit Type and Rh: 7300
Unit Type and Rh: 7300
Unit Type and Rh: 7300

## 2021-07-27 LAB — BASIC METABOLIC PANEL
Anion gap: 3 — ABNORMAL LOW (ref 5–15)
BUN: 13 mg/dL (ref 8–23)
CO2: 24 mmol/L (ref 22–32)
Calcium: 7.7 mg/dL — ABNORMAL LOW (ref 8.9–10.3)
Chloride: 109 mmol/L (ref 98–111)
Creatinine, Ser: 0.88 mg/dL (ref 0.61–1.24)
GFR, Estimated: >60 mL/min (ref >60)
Glucose, Bld: 111 mg/dL — ABNORMAL HIGH (ref 70–99)
Potassium: 3.7 mmol/L (ref 3.5–5.1)
Sodium: 136 mmol/L (ref 135–145)

## 2021-07-27 LAB — MAGNESIUM: Magnesium: 1.9 mg/dL (ref 1.7–2.4)

## 2021-07-27 LAB — GLUCOSE, CAPILLARY: Glucose-Capillary: 116 mg/dL — ABNORMAL HIGH (ref 70–99)

## 2021-07-27 LAB — PREPARE RBC (CROSSMATCH)

## 2021-07-27 LAB — URINE CULTURE: Culture: NO GROWTH

## 2021-07-27 LAB — HEMOGLOBIN AND HEMATOCRIT, BLOOD
HCT: 29.8 % — ABNORMAL LOW (ref 39.0–52.0)
Hemoglobin: 10.2 g/dL — ABNORMAL LOW (ref 13.0–17.0)

## 2021-07-27 LAB — VITAMIN D 25 HYDROXY (VIT D DEFICIENCY, FRACTURES): Vit D, 25-Hydroxy: 18.67 ng/mL — ABNORMAL LOW (ref 30–100)

## 2021-07-27 MED ORDER — FUROSEMIDE 10 MG/ML IJ SOLN
20.0000 mg | Freq: Once | INTRAMUSCULAR | Status: AC
  Administered 2021-07-27: 12:00:00 20 mg via INTRAVENOUS
  Filled 2021-07-27: qty 2

## 2021-07-27 MED ORDER — VITAMIN B-12 1000 MCG PO TABS: 1000.0000 ug | ORAL_TABLET | Freq: Every day | ORAL | Status: DC

## 2021-07-27 MED ORDER — PANTOPRAZOLE SODIUM 40 MG PO TBEC: 40.0000 mg | DELAYED_RELEASE_TABLET | Freq: Every day | ORAL | 1 refills | Status: DC

## 2021-07-27 MED ORDER — SODIUM CHLORIDE 0.9% IV SOLUTION: Freq: Once | INTRAVENOUS | Status: DC

## 2021-07-27 MED ORDER — FUROSEMIDE 10 MG/ML IJ SOLN
20.0000 mg | Freq: Once | INTRAMUSCULAR | Status: AC
  Administered 2021-07-27: 09:00:00 20 mg via INTRAVENOUS
  Filled 2021-07-27: qty 2

## 2021-07-27 MED ORDER — ACETAMINOPHEN 500 MG PO TABS: 500.0000 mg | ORAL_TABLET | Freq: Four times a day (QID) | ORAL | 0 refills | Status: AC | PRN

## 2021-07-27 MED ORDER — ACETAMINOPHEN 325 MG PO TABS
650.0000 mg | ORAL_TABLET | Freq: Once | ORAL | Status: AC
  Administered 2021-07-27: 09:00:00 650 mg via ORAL
  Filled 2021-07-27: qty 2

## 2021-07-27 NOTE — Discharge Summary (Signed)
Physician Discharge Summary  KENTRAIL SHEW NIO:[id number] DOB: 26-Jul-1960 DOA: 07/23/2021  PCP: Azzie Glatter, FNP  Admit date: 07/23/2021 Discharge date: 07/27/2021  Time spent: 60 minutes  Recommendations for Outpatient Follow-up:  Follow-up with Azzie Glatter, FNP in 2 weeks.  On follow-up patient will need a CBC, basic metabolic profile, magnesium level checked to follow-up on electrolytes, renal function, hemoglobin.  Patient also noted to have vitamin B12 deficiency with need to be followed up upon. Follow-up with Dr. Loletha Carrow, gastroenterology in 2 weeks for repeat labs and in 4 weeks for hospital follow-up   Discharge Diagnoses:  Principal Problem:   GIB (gastrointestinal bleeding) Active Problems:   Acute blood loss anemia   Syncope   Elevated troponin   Vitamin B12 deficiency   Leukocytosis   Discharge Condition: Stable and improved  Diet recommendation: Regular  Filed Weights   07/23/21 0959 07/26/21 0500 07/27/21 0500  Weight: 129.3 kg 132.1 kg 121.5 kg    History of present illness:  HPI per Dr. Roselee Culver is a 61 y.o. male with medical history significant of HTN. Presenting with rectal bleeding. He was in his normal state of health until this morning. When he woke, he went to the bathroom and had a bloody BM. There was no abdominal or rectal pain. He dismissed it and went to work. When he got to work, he had another bloody BM. He decided to go home and rest. When he got back home, he had a third bloody BM. This time, he began to feel lightheaded and dizzy. His family called for EMS. Upon arrival, the patient passed out and had a 4th bloody BM. He came to and was brought to the ED for evaluation. He denies any anticoagulation or regular NSAID use. He denies any personal cancer history, but does report colon cancer in his mother and father. His last c-scope was in 2019 per his recollection.  He denied any other aggravating or alleviating factors.    ED  Course: Hgb was 11.5. He was heme positive. [facility] was consulted. [facility] was called for admission.   Hospital Course:  1 GI bleed concern for probable diverticular bleed -Patient presented with 3-4 episodes of bloody bowel movements/rectal bleeding, painless hematochezia. -Patient with prior history of extensive left-sided diverticular disease per colonoscopy of 2018.  Patient denies any significant daily NSAID use. -Patient with no abdominal pain. -CT angiogram abdomen and pelvis done on admission, negative for any active extravasation. -Patient seen in consultation by GI who feel patient may have a diverticular bleed recommending stat RBC tagged scan to localize site of bleeding if patient with recurrent hematochezia with IR consult for embolization on initial admission.. -Hemoglobin currently at dropped as low as 7.4 from 11.5 on admission. - Hemoglobin noted at 13.2 02/20/2017. -Status post transfusion 8 units packed red blood cells during this hospitalization. -Patient noted to have been having intermittent bloody bowel movements throughout the hospitalization.   -Noted to have significant bloody bowel movement the evening of 07/25/2021, with hemoglobin going as low as 7.6 requiring transfusions.  -Anemia panel consistent with anemia of chronic disease with iron level of 58, TIBC of 232 folate of 15.1, vitamin B12 of 148. -Patient underwent second CT angiogram abdomen and pelvis (07/25/2021) which was negative for any active bleed. -Patient placed back on a diet of clear liquids which was advanced to full liquid diet and soft diet which he tolerated. -Patient followed by GI who feel patient has a presumed diverticular  bleed, feels bleeding has subsided, drop in hemoglobin felt likely due to equilibrium effect, recommending probable safe for discharge after transfusion of packed red blood cells on day of discharge. -Patient will be discharged in stable and improved condition. -Outpatient follow-up  with GI for repeat labs in 2 weeks and hospital follow-up in 5 to 6 weeks.  2.  Acute blood loss anemia/vitamin B12 deficiency -Secondary to problem #1. -Serial CBCs were done during the hospitalization. -Transfusion threshold hemoglobin < 8.  -Patient noted to have a vitamin B12 deficiency with vitamin B12 levels at 148. -Patient started on vitamin B12 supplementation with 1000 MCG's subcu daily during the hospitalization and will be discharged home on oral vitamin B12 supplementation.  -Outpatient follow-up with PCP.   3.  Syncope -Likely secondary to problem #1. -No further episodes during the hospitalization. -2D echo done with no valvular abnormalities, normal EF, NWMA.  4.  Elevated troponin -Patient with mildly elevated troponin likely demand ischemia secondary to problem #1. -Patient denies any chest pain. -EKG with nonspecific intraventricular conduction delay. -2D echo with a EF of 55 to 60%, NWMA, grade 2 diastolic dysfunction, mildly dilated left atrial size, normal right ventricular systolic function, trivial MVR, no evidence of mitral stenosis.   -No further cardiac work-up needed at this time.   5.  Hyperglycemia -No history of diabetes. -Hemoglobin A1c 5.6 (12/19/2017) -Repeat hemoglobin A1c 6.2. -Outpatient follow-up.  6.  Leukocytosis -Likely reactive leukocytosis secondary to problem #1. -Patient afebrile, denied any shortness of breath, denied any dysuria. -X-ray done with no acute abnormalities. -Urinalysis bland.  -Leukocytosis trended down.  -Outpatient follow-up.    Procedures: CT angiogram abdomen and pelvis 07/23/2021, 07/25/2021 Chest x-ray 07/23/2021, 07/26/2021 2D echo 07/24/2021  Transfusion 2 unit packed red blood cells 07/24/2021 Transfusion 3 units packed red blood cells  07/25/2021 Transfused 1 unit packed red blood cells 07/26/2021 Transfusion 2 units packed red blood cells 07/27/2021  Consultations: Gastroenterology: Dr. Silverio Decamp  07/23/2021  Discharge Exam: Vitals:   07/27/21 1500 07/27/21 1600  BP: 136/68 137/69  Pulse:  63  Resp: (!) 21 13  Temp:    SpO2:  99%    General: NAD Cardiovascular: Regular rate rhythm no murmurs rubs or gallops.  No JVD.  Trace bilateral lower extremity edema.  Respiratory: Clear to auscultation bilaterally.  No wheezes, no crackles, no rhonchi.  Discharge Instructions   Discharge Instructions     Diet general   Complete by: As directed    Increase activity slowly   Complete by: As directed       Allergies as of 07/27/2021       Reactions   Amlodipine    Nosebleed    Losartan    Nosebleed    Penicillins Hives   Has patient had a PCN reaction causing immediate rash, facial/tongue/throat swelling, SOB or lightheadedness with hypotension: No Has patient had a PCN reaction causing severe rash involving mucus membranes or skin necrosis: No Has patient had a PCN reaction that required hospitalization: No Has patient had a PCN reaction occurring within the last 10 years: No If all of the above answers are "NO", then may proceed with Cephalosporin use. Pt tolerated Keflex in April 2018        Medication List     STOP taking these medications    ibuprofen 200 MG tablet Commonly known as: ADVIL   nabumetone 500 MG tablet Commonly known as: RELAFEN   naproxen 500 MG tablet Commonly known as: NAPROSYN  TAKE these medications    acetaminophen 500 MG tablet Commonly known as: TYLENOL Take 1 tablet (500 mg total) by mouth every 6 (six) hours as needed.   pantoprazole 40 MG tablet Commonly known as: Protonix Take 1 tablet (40 mg total) by mouth daily.   vitamin B-12 1000 MCG tablet Commonly known as: CYANOCOBALAMIN Take 1 tablet (1,000 mcg total) by mouth daily.       Allergies  Allergen Reactions   Amlodipine     Nosebleed    Losartan     Nosebleed    Penicillins Hives    Has patient had a PCN reaction causing immediate rash,  facial/tongue/throat swelling, SOB or lightheadedness with hypotension: No Has patient had a PCN reaction causing severe rash involving mucus membranes or skin necrosis: No Has patient had a PCN reaction that required hospitalization: No Has patient had a PCN reaction occurring within the last 10 years: No If all of the above answers are "NO", then may proceed with Cephalosporin use. Pt tolerated Keflex in April 2018    Follow-up Information     Danis, Estill Cotta III, MD. Schedule an appointment as soon as possible for a visit in 2 week(s).   Specialty: Gastroenterology Why: Follow-up for lab work, CBC. Contact information: [place] [address] [phone]         Azzie Glatter, FNP. Schedule an appointment as soon as possible for a visit in 2 week(s).   Specialty: Family Medicine Contact information: [place] [address] [place], [place], [place] Follow up in 4 week(s).   Specialty: Gastroenterology Why: Hospital follow-up Contact information: [address] [place] [address] [phone]                  The results of significant diagnostics from this hospitalization (including imaging, microbiology, ancillary and laboratory) are listed below for reference.    Significant Diagnostic Studies: DG CHEST PORT 1 VIEW  Result Date: 07/26/2021 CLINICAL DATA:  Leukocytosis EXAM: PORTABLE CHEST 1 VIEW COMPARISON:  Chest x-ray 07/23/2021 FINDINGS: Heart size and mediastinal contours are within normal limits. No suspicious pulmonary opacities identified. No pleural effusion or pneumothorax visualized. No acute osseous abnormality appreciated. IMPRESSION: No acute intrathoracic process identified. Electronically Signed   By: Ofilia Neas M.D.   On: 07/26/2021 11:21   DG Chest Port 1 View  Result Date: 07/23/2021 CLINICAL DATA:  Syncope and rectal bleeding. EXAM: PORTABLE CHEST 1 VIEW  COMPARISON:  Chest x-ray dated June 23, 2021. FINDINGS: The heart size and mediastinal contours are within normal limits. Both lungs are clear. The visualized skeletal structures are unremarkable. IMPRESSION: No active disease. Electronically Signed   By: Titus Dubin M.D.   On: 07/23/2021 12:18   ECHOCARDIOGRAM COMPLETE  Result Date: 07/24/2021    ECHOCARDIOGRAM REPORT   Patient Name:   Todd Osborn Date of Exam: 07/24/2021 Medical Rec #:  [id number]     Height:       72.0 in Accession #:    [id number]    Weight:       285.0 lb Date of Birth:  [birth year]     BSA:          2.477 m Patient Age:    36 years      BP:           147/97 mmHg Patient Gender: M  HR:           58 bpm. Exam Location:  Inpatient Procedure: 2D Echo, Cardiac Doppler, Color Doppler and Intracardiac            Opacification Agent Indications:    Syncope  History:        Patient has no prior history of Echocardiogram examinations.                 Diverticulitis. Blood in stool, "Passed out yesterday".  Sonographer:    Merrie Roof RDCS Referring Phys: [id number] [place]  1. Left ventricular ejection fraction, by estimation, is 55 to 60%. The left ventricle has normal function. The left ventricle has no regional wall motion abnormalities. There is mild left ventricular hypertrophy. Left ventricular diastolic parameters are consistent with Grade II diastolic dysfunction (pseudonormalization). Elevated left ventricular end-diastolic pressure.  2. Right ventricular systolic function is normal. The right ventricular size is normal.  3. Left atrial size was mildly dilated.  4. The mitral valve is abnormal. Trivial mitral valve regurgitation. No evidence of mitral stenosis.  5. The aortic valve is tricuspid. There is mild calcification of the aortic valve. There is mild thickening of the aortic valve. Aortic valve regurgitation is not visualized. Aortic valve sclerosis/calcification is present, without any evidence of  aortic stenosis.  6. The inferior vena cava is normal in size with greater than 50% respiratory variability, suggesting right atrial pressure of 3 mmHg. FINDINGS  Left Ventricle: Left ventricular ejection fraction, by estimation, is 55 to 60%. The left ventricle has normal function. The left ventricle has no regional wall motion abnormalities. Definity contrast agent was given IV to delineate the left ventricular  endocardial borders. The left ventricular internal cavity size was normal in size. There is mild left ventricular hypertrophy. Left ventricular diastolic parameters are consistent with Grade II diastolic dysfunction (pseudonormalization). Elevated left ventricular end-diastolic pressure. Right Ventricle: The right ventricular size is normal. No increase in right ventricular wall thickness. Right ventricular systolic function is normal. Left Atrium: Left atrial size was mildly dilated. Right Atrium: Right atrial size was normal in size. Pericardium: There is no evidence of pericardial effusion. Mitral Valve: The mitral valve is abnormal. There is mild thickening of the mitral valve leaflet(s). There is mild calcification of the mitral valve leaflet(s). Trivial mitral valve regurgitation. No evidence of mitral valve stenosis. Tricuspid Valve: The tricuspid valve is normal in structure. Tricuspid valve regurgitation is trivial. No evidence of tricuspid stenosis. Aortic Valve: The aortic valve is tricuspid. There is mild calcification of the aortic valve. There is mild thickening of the aortic valve. Aortic valve regurgitation is not visualized. Aortic valve sclerosis/calcification is present, without any evidence of aortic stenosis. Aortic valve mean gradient measures 7.0 mmHg. Aortic valve peak gradient measures 12.7 mmHg. Aortic valve area, by VTI measures 2.79 cm. Pulmonic Valve: The pulmonic valve was normal in structure. Pulmonic valve regurgitation is not visualized. No evidence of pulmonic stenosis.  Aorta: The aortic root is normal in size and structure. Venous: The inferior vena cava is normal in size with greater than 50% respiratory variability, suggesting right atrial pressure of 3 mmHg. IAS/Shunts: No atrial level shunt detected by color flow Doppler.  LEFT VENTRICLE PLAX 2D LVIDd:         4.70 cm   Diastology LVIDs:         2.90 cm   LV e' medial:    6.64 cm/s LV PW:  1.20 cm   LV E/e' medial:  18.5 LV IVS:        1.20 cm   LV e' lateral:   7.51 cm/s LVOT diam:     2.20 cm   LV E/e' lateral: 16.4 LV SV:         93 LV SV Index:   38 LVOT Area:     3.80 cm  RIGHT VENTRICLE RV Basal diam:  3.70 cm LEFT ATRIUM              Index        RIGHT ATRIUM           Index LA diam:        4.20 cm  1.70 cm/m   RA Area:     17.90 cm LA Vol (A2C):   102.0 ml 41.18 ml/m  RA Volume:   45.00 ml  18.17 ml/m LA Vol (A4C):   76.9 ml  31.05 ml/m LA Biplane Vol: 90.8 ml  36.66 ml/m  AORTIC VALVE AV Area (Vmax):    2.86 cm AV Area (Vmean):   2.72 cm AV Area (VTI):     2.79 cm AV Vmax:           178.00 cm/s AV Vmean:          116.000 cm/s AV VTI:            0.334 m AV Peak Grad:      12.7 mmHg AV Mean Grad:      7.0 mmHg LVOT Vmax:         134.00 cm/s LVOT Vmean:        82.900 cm/s LVOT VTI:          0.245 m LVOT/AV VTI ratio: 0.73  AORTA Ao Root diam: 3.10 cm Ao Asc diam:  3.00 cm MITRAL VALVE MV Area (PHT): 3.23 cm     SHUNTS MV Decel Time: 235 msec     Systemic VTI:  0.24 m MV E velocity: 123.00 cm/s  Systemic Diam: 2.20 cm MV A velocity: 111.00 cm/s MV E/A ratio:  1.11 Jenkins Rouge MD Electronically signed by Jenkins Rouge MD Signature Date/Time: 07/24/2021/1:32:42 PM    Final    CT Angio Abd/Pel w/ and/or w/o  Result Date: 07/25/2021 CLINICAL DATA:  Intermittent acute hematochezia with large liquid bloody bowel movement earlier this evening. EXAM: CTA ABDOMEN AND PELVIS WITHOUT AND WITH CONTRAST TECHNIQUE: Multidetector CT imaging of the abdomen and pelvis was performed using the standard protocol prior to  and during bolus administration of intravenous contrast. Arterial and venous phases were performed after contrast administration with multiplanar reconstructed images and MIPs obtained and reviewed to evaluate the vascular anatomy. RADIATION DOSE REDUCTION: This exam was performed according to the departmental dose-optimization program which includes automated exposure control, adjustment of the mA and/or kV according to patient size and/or use of iterative reconstruction technique. CONTRAST:  144mL OMNIPAQUE IOHEXOL 350 MG/ML SOLN COMPARISON:  Prior CTA on 07/23/2021 FINDINGS: VASCULAR Aorta: Stable mild atherosclerosis of the abdominal aorta without evidence of aneurysm or dissection. Celiac: Normally patent. Normally patent branch vessels and branch vessel anatomy. No acute bleeding source identified in the celiac distribution on arterial or delayed phases. SMA: Normally patent. Visualized branch vessels are normally patent. No acute bleeding source identified in the SMA distribution on arterial or delayed phases. Renals: Two separate right renal arteries and a single left renal artery demonstrate normal patency. IMA: Normally patent. No acute bleeding source identified in  the IMA territory. Inflow: Bilateral iliac arteries demonstrate normal patency. Proximal Outflow: Bilateral common femoral arteries and femoral bifurcations are normally patent. Veins: Venous phase imaging demonstrates normal patency of venous structures including the IVC, hepatic veins, renal veins, iliac veins, common femoral veins, mesenteric veins, splenic vein and portal vein. Review of the MIP images confirms the above findings. NON-VASCULAR Lower chest: No acute abnormality. Hepatobiliary: Stable hepatic steatosis. No hepatic masses or biliary dilatation. Vicarious excretion of contrast in the gallbladder. Pancreas: Unremarkable. No pancreatic ductal dilatation or surrounding inflammatory changes. Spleen: Normal size.  Stable cystic  region in the mid spleen. Adrenals/Urinary Tract: Normal adrenal glands. No renal masses or hydronephrosis. Stable parapelvic renal cysts bilaterally. The bladder is unremarkable. Stomach/Bowel: Bowel shows no evidence obstruction, ileus or inflammation. Stable diverticulosis of the descending and sigmoid colon without evidence of acute diverticulitis. No active contrast extravasation into bowel lumen on arterial or venous phases of imaging. Lymphatic: No enlarged abdominal or pelvic lymph nodes identified. Reproductive: Prostate is unremarkable. Other: No hernias identified. No ascites or abnormal fluid collections. Musculoskeletal: No acute or significant osseous findings. IMPRESSION: 1. No active bleeding source identified by CTA on arterial or venous phases of imaging into the gastrointestinal tract. 2. Stable diverticulosis of descending and sigmoid colon without diverticulitis or other inflammatory process. 3. Stable hepatic steatosis. Electronically Signed   By: Aletta Edouard M.D.   On: 07/25/2021 18:45   CT Angio Abd/Pel w/ and/or w/o  Result Date: 07/23/2021 CLINICAL DATA:  Hypertension, rectal bleeding EXAM: CTA ABDOMEN AND PELVIS WITHOUT AND WITH CONTRAST TECHNIQUE: Multidetector CT imaging of the abdomen and pelvis was performed using the standard protocol during bolus administration of intravenous contrast. Multiplanar reconstructed images and MIPs were obtained and reviewed to evaluate the vascular anatomy. RADIATION DOSE REDUCTION: This exam was performed according to the departmental dose-optimization program which includes automated exposure control, adjustment of the mA and/or kV according to patient size and/or use of iterative reconstruction technique. CONTRAST:  131mL OMNIPAQUE IOHEXOL 350 MG/ML SOLN COMPARISON:  None. FINDINGS: VASCULAR Aorta: Minimal calcified atheromatous plaque. No aneurysm, dissection, or stenosis. Celiac: Patent without evidence of aneurysm, dissection, vasculitis or  significant stenosis. SMA: Patent without evidence of aneurysm, dissection, vasculitis or significant stenosis. Renals: Single left, widely patent. Duplicated right, superior dominant, both patent. IMA: Patent without evidence of aneurysm, dissection, vasculitis or significant stenosis. Inflow: Minimal calcified plaque. No aneurysm, dissection, or stenosis. Proximal Outflow: Minimally atheromatous, patent Veins: Portal vein patent.  No venous pathology evident. Review of the MIP images confirms the above findings. NON-VASCULAR Lower chest: No pleural or pericardial effusion. Visualized lung bases clear. Hepatobiliary: No focal liver abnormality is seen. No gallstones, gallbladder wall thickening, or biliary dilatation. Pancreas: Unremarkable. No pancreatic ductal dilatation or surrounding inflammatory changes. Spleen: Normal in size.  1.6 cm mid splenic cyst. Adrenals/Urinary Tract: 2.5 cm right adrenal adenoma. No urolithiasis or hydronephrosis. Probable parapelvic cysts bilaterally. Urinary bladder is incompletely distended. Stomach/Bowel: Stomach is nondistended. Small bowel decompressed. Normal appendix. The colon is nondilated. Multiple descending and sigmoid diverticula without adjacent inflammatory change. No active extravasation. Lymphatic: No abdominal or pelvic adenopathy. Reproductive: Mild prostate enlargement with central coarse calcifications. Other: No ascites.  No free air. Musculoskeletal: Mild bilateral hip DJD. Spondylitic changes in the lower lumbar spine. IMPRESSION: 1. No evidence of active GI bleed. 2. Descending and sigmoid diverticulosis 3.  Aortic Atherosclerosis (ICD10-170.0). Electronically Signed   By: Lucrezia Europe M.D.   On: 07/23/2021 15:00    Microbiology: Recent  Results (from the past 240 hour(s))  Resp Panel by RT-PCR (Flu A&B, Covid) Nasopharyngeal Swab     Status: None   Collection Time: 07/23/21 10:59 AM   Specimen: Nasopharyngeal Swab; Nasopharyngeal(NP) swabs in vial  transport medium  Result Value Ref Range Status   SARS Coronavirus 2 by RT PCR NEGATIVE NEGATIVE Final    Comment: (NOTE) SARS-CoV-2 target nucleic acids are NOT DETECTED.  The SARS-CoV-2 RNA is generally detectable in upper respiratory specimens during the acute phase of infection. The lowest concentration of SARS-CoV-2 viral copies this assay can detect is 138 copies/mL. A negative result does not preclude SARS-Cov-2 infection and should not be used as the sole basis for treatment or other patient management decisions. A negative result may occur with  improper specimen collection/handling, submission of specimen other than nasopharyngeal swab, presence of viral mutation(s) within the areas targeted by this assay, and inadequate number of viral copies(<138 copies/mL). A negative result must be combined with clinical observations, patient history, and epidemiological information. The expected result is Negative.  Fact Sheet for Patients:  EntrepreneurPulse.com.au  Fact Sheet for Healthcare Providers:  IncredibleEmployment.be  This test is no t yet approved or cleared by the [place] FDA and  has been authorized for detection and/or diagnosis of SARS-CoV-2 by FDA under an Emergency Use Authorization (EUA). This EUA will remain  in effect (meaning this test can be used) for the duration of the COVID-19 declaration under Section 564(b)(1) of the Act, 21 U.S.C.section 360bbb-3(b)(1), unless the authorization is terminated  or revoked sooner.       Influenza A by PCR NEGATIVE NEGATIVE Final   Influenza B by PCR NEGATIVE NEGATIVE Final    Comment: (NOTE) The Xpert Xpress SARS-CoV-2/FLU/RSV plus assay is intended as an aid in the diagnosis of influenza from Nasopharyngeal swab specimens and should not be used as a sole basis for treatment. Nasal washings and aspirates are unacceptable for Xpert Xpress SARS-CoV-2/FLU/RSV testing.  Fact  Sheet for Patients: EntrepreneurPulse.com.au  Fact Sheet for Healthcare Providers: IncredibleEmployment.be  This test is not yet approved or cleared by the [place] FDA and has been authorized for detection and/or diagnosis of SARS-CoV-2 by FDA under an Emergency Use Authorization (EUA). This EUA will remain in effect (meaning this test can be used) for the duration of the COVID-19 declaration under Section 564(b)(1) of the Act, 21 U.S.C. section 360bbb-3(b)(1), unless the authorization is terminated or revoked.  Performed at [facility], [place] [address], [place], [place] [address]   MRSA Next Gen by PCR, Nasal     Status: None   Collection Time: 07/24/21  3:58 PM   Specimen: Nasal Mucosa; Nasal Swab  Result Value Ref Range Status   MRSA by PCR Next Gen NOT DETECTED NOT DETECTED Final    Comment: (NOTE) The GeneXpert MRSA Assay (FDA approved for NASAL specimens only), is one component of a comprehensive MRSA colonization surveillance program. It is not intended to diagnose MRSA infection nor to guide or monitor treatment for MRSA infections. Test performance is not FDA approved in patients less than 61 years old. Performed at [facility], [place] [address], [place], [place] [address]   Urine Culture     Status: None   Collection Time: 07/26/21 10:55 AM   Specimen: Urine, Clean Catch  Result Value Ref Range Status   Specimen Description   Final    URINE, CLEAN CATCH Performed at [facility], [address] [place], [place]  [address]    Special Requests   Final    NONE Performed at [facility], [place] [address] [place], [place] [address]    Culture   Final    NO GROWTH Performed at [facility], [place] [address] [place], [place] [address]    Report Status 07/27/2021 FINAL  Final     Labs: Basic Metabolic Panel: Recent Labs  Lab 07/23/21 1012  07/24/21 0108 07/25/21 0249 07/26/21 0741 07/27/21 0259 07/27/21 0659  NA 139 136 137 135 136  --   K 4.0 3.8 4.0 3.5 3.7  --   CL 110 105 110 107 109  --   CO2 23 25 22 23 24   --   GLUCOSE 173* 126* 158* 135* 111*  --   BUN 24* 15 18 15 13   --   CREATININE 1.04 0.86 0.82 0.95 0.88  --   CALCIUM 7.9* 8.1* 7.4* 7.5* 7.7*  --   MG  --   --  1.8 1.8  --  1.9   Liver Function Tests: Recent Labs  Lab 07/23/21 1012 07/24/21 0108  AST 18 18  ALT 21 19  ALKPHOS 41 36*  BILITOT 0.2* 0.8  PROT 6.4* 6.2*  ALBUMIN 3.4* 3.3*   No results for input(s): LIPASE, AMYLASE in the last 168 hours. No results for input(s): AMMONIA in the last 168 hours. CBC: Recent Labs  Lab 07/23/21 1012 07/23/21 1342 07/24/21 0108 07/24/21 0746 07/25/21 1730 07/26/21 0741 07/26/21 1438 07/27/21 0259 07/27/21 1559  WBC 12.6*  --  12.6*  --   --  17.6* 15.8* 11.2*  --   NEUTROABS  --   --   --   --   --  12.1*  --   --   --   HGB 11.5*   < > 9.8*   < > 7.6* 8.8* 7.9* 7.4* 10.2*  HCT 35.0*   < > 29.9*   < > 23.3* 25.2* 22.9* 22.1* 29.8*  MCV 89.3  --  88.5  --   --  86.0 87.7 89.8  --   PLT 248  --  258  --   --  188 191 169  --    < > = values in this interval not displayed.   Cardiac Enzymes: No results for input(s): CKTOTAL, CKMB, CKMBINDEX, TROPONINI in the last 168 hours. BNP: BNP (last 3 results) No results for input(s): BNP in the last 8760 hours.  ProBNP (last 3 results) No results for input(s): PROBNP in the last 8760 hours.  CBG: Recent Labs  Lab 07/25/21 0745 07/25/21 1625 07/26/21 0748 07/27/21 0742  GLUCAP 178* 169* 126* 116*       Signed:  Irine Seal MD.  [facility] 07/27/2021, 5:39 PM

## 2021-07-27 NOTE — Telephone Encounter (Signed)
Pt has been scheduled for a follow up visit with Dr. Loletha Carrow on 09/03/21 at 1:20 pm. Labs have been ordered. Letter will be sent to pt's home address, since pt is still currently admitted.

## 2021-07-27 NOTE — Progress Notes (Signed)
[facility] Progress Note    Since last GI note: He slept well overnight.  Ate regular food last night without nausea, vomiting or abd pains. No bleeding (or BM) in 38 hours  Objective: Vital signs in last 24 hours: Temp:  [97.8 F (36.6 C)-98.8 F (37.1 C)] 97.8 F (36.6 C) (01/24 0400) Pulse Rate:  [61-96] 79 (01/24 0600) Resp:  [0-29] 17 (01/24 0600) BP: (93-164)/(35-96) 109/68 (01/24 0600) SpO2:  [93 %-99 %] 99 % (01/24 0600) Weight:  [121.5 kg] 121.5 kg (01/24 0500) Last BM Date: 07/25/21 General: alert and oriented times 3 Heart: regular rate and rythm Abdomen: soft, non-tender, non-distended, normal bowel sounds  Lab Results: Recent Labs    07/26/21 0741 07/26/21 1438 07/27/21 0259  WBC 17.6* 15.8* 11.2*  HGB 8.8* 7.9* 7.4*  PLT 188 191 169  MCV 86.0 87.7 89.8   Recent Labs    07/25/21 0249 07/26/21 0741 07/27/21 0259  NA 137 135 136  K 4.0 3.5 3.7  CL 110 107 109  CO2 22 23 24   GLUCOSE 158* 135* 111*  BUN 18 15 13   CREATININE 0.82 0.95 0.88  CALCIUM 7.4* 7.5* 7.7*    Medications: Scheduled Meds:  sodium chloride   Intravenous Once   sodium chloride   Intravenous Once   Chlorhexidine Gluconate Cloth  6 each Topical Daily   cyanocobalamin  1,000 mcg Subcutaneous Daily   pantoprazole (PROTONIX) IV  40 mg Intravenous Q12H   sodium chloride flush  3 mL Intravenous Q12H   Continuous Infusions: PRN Meds:.   Assessment/Plan: 61 y.o. male with painless red rectal bleeding, presumed diverticular  His Hb has drifted to low 7s without any overt bleeding (or BM) in 38 hours now.  He is going to be getting his 7th and 8th unit of blood this AM.  He doesn't seem to be bleeding anymore. The Hb drop is probably just "equilibration" effect.  If he is doing well without recurrent bleeding by later this afternoon then he is probably safe for d/c.  We will arrange repeat labs in 2 weeks and follow up in our office in 5-6 weeks. He understands that if  he starts to rebleed prior to then he needs to go directly to the ER.  Please call or page with any further questions or concerns.   Milus Banister, MD  07/27/2021, 7:37 AM [facility] Pager [phone]

## 2021-07-27 NOTE — Progress Notes (Signed)
PROGRESS NOTE    Todd Osborn  IWP:[id number] DOB: [birth year] DOA: 07/23/2021 PCP: Azzie Glatter, FNP    Chief Complaint  Patient presents with   GI Bleeding    Brief Narrative: Patient 61 year old gentleman history of hypertension, extensive left-sided diverticulosis presented to the ED with 3-4 episodes of rectal bleeding/bloody bowel movements with some associated lightheadedness, dizziness and syncopal episode.  Patient seen in the ED hemoglobin noted to be stable.  CT angiogram abdomen and pelvis done was negative.  Patient admitted serial CBCs ordered.  Patient placed on IV fluids.  GI consulted who assessed the patient felt likely diverticular bleed and recommended if patient develops any further episodes of recurrent hematochezia to get a stat RBC tagged scan to localize site of bleeding and consult with IR.   Assessment & Plan:   Principal Problem:   GIB (gastrointestinal bleeding) Active Problems:   Acute blood loss anemia   Syncope   Elevated troponin   Vitamin B12 deficiency   Leukocytosis  #1 GI bleed concern for probable diverticular bleed -Patient presented with 3-4 episodes of bloody bowel movements/rectal bleeding, painless hematochezia. -Patient with prior history of extensive left-sided diverticular disease per colonoscopy of 2018.  Patient denies any significant daily NSAID use. -Patient with no abdominal pain. -CT angiogram abdomen and pelvis done on admission, negative for any active extravasation. -Patient seen in consultation by GI who feel patient may have a diverticular bleed recommending stat RBC tagged scan to localize site of bleeding if patient with recurrent hematochezia with IR consult for embolization on initial admission.. -Hemoglobin currently at 7.4 from 7.9 from 8.8 from 7.5 from 8.2 from 7.0 from 8.3 from 9.7 from 11.5 on admission.  Hemoglobin noted at 13.2 02/20/2017. -Status post transfusion 6 units packed red blood cells during this  hospitalization. -Patient noted to have been having intermittent bloody bowel movements throughout the hospitalization.   -Noted to have significant bloody bowel movement the evening of 07/25/2021, with hemoglobin going as low as 7.6 requiring transfusions.  -Anemia panel consistent with anemia of chronic disease with iron level of 58, TIBC of 232 folate of 15.1, vitamin B12 of 148. -Patient underwent second CT angiogram abdomen and pelvis (07/25/2021) which was negative for any active bleed. -Patient placed back on a diet of clear liquids which was advanced to full liquid diet and soft diet which he tolerated. -Patient being followed by GI who feel patient has a presumed diverticular bleed, feels bleeding has subsided, drop in hemoglobin felt likely due to equilibrium effect, recommending probable safe for discharge after transfusion of packed red blood cells today. -Outpatient follow-up with GI for repeat labs in 2 weeks and hospital follow-up in 5 to 6 weeks.  2.  Acute blood loss anemia/vitamin B12 deficiency -Secondary to problem #1. -Serial CBCs. -Transfusion threshold hemoglobin < 8.  -Patient noted to have a vitamin B12 deficiency with vitamin B12 levels at 148. -Patient started on vitamin B12 supplementation with 1000 MCG's subcu daily x54days, then weekly x1 month, then monthly. -Could potentially discharge on oral vitamin B12 supplementation. -Outpatient follow-up. -Follow.  3.  Syncope -Likely secondary to problem #1. -No further episodes. -Follow-up.  4.  Elevated troponin -Patient with mildly elevated troponin likely demand ischemia secondary to problem #1. -Patient denies any chest pain. -EKG with nonspecific intraventricular conduction delay. -2D echo with a EF of 55 to 60%, NWMA, grade 2 diastolic dysfunction, mildly dilated left atrial size, normal right ventricular systolic function, trivial MVR, no evidence of mitral  stenosis.   -No further cardiac work-up needed at  this time.   5.  Hyperglycemia -No history of diabetes. -Hemoglobin A1c 5.6 (12/19/2017) -Repeat hemoglobin A1c 6.2. -Outpatient follow-up.  6.  Leukocytosis -Likely reactive leukocytosis secondary to problem #1. -Patient afebrile, denies any shortness of breath, denies any dysuria. -X-ray done with no acute abnormalities. -Urinalysis bland.  -Leukocytosis trending down.  -No need for antibiotics at this time. -Follow-up.   DVT prophylaxis: SCDs Code Status: Full Family Communication: Updated patient.  No family at bedside. Disposition:   Status is: Observation  The patient remains OBS appropriate and will d/c before 2 midnights.      Consultants:  Gastroenterology: Dr. Silverio Decamp 07/23/2021  Procedures:  CT angiogram abdomen and pelvis 07/23/2021, 07/25/2021 Chest x-ray 07/23/2021, 07/26/2021 2D echo 07/24/2021  Transfusion 2 unit packed red blood cells 07/24/2021 Transfusion 3 units packed red blood cells  07/25/2021 Transfused 1 unit packed red blood cells 07/26/2021 Transfusion 2 units packed red blood cells 07/27/2021     Antimicrobials:  None   Subjective: Laying in bed.  States no further bleeding this morning.  Feeling much better.  No lightheadedness or dizziness.  Currently receiving first unit of packed red blood cells.  Tolerating current diet.  States gastroenterology states he could be discharged home after transfusions today.  Hoping to go home today.    Objective: Vitals:   07/27/21 0826 07/27/21 0900 07/27/21 0915 07/27/21 0930  BP:  (!) 143/59 (!) 143/59 (!) 153/60  Pulse: 73 67 70 73  Resp: 12 (!) 4 18 15   Temp: 98 F (36.7 C)  98.8 F (37.1 C) 98.7 F (37.1 C)  TempSrc: Oral  Oral Oral  SpO2: 97% 97% 99% 98%  Weight:      Height:        Intake/Output Summary (Last 24 hours) at 07/27/2021 1016 Last data filed at 07/27/2021 0915 Gross per 24 hour  Intake 957.59 ml  Output 1435 ml  Net -477.41 ml    Filed Weights   07/23/21 0959 07/26/21  0500 07/27/21 0500  Weight: 129.3 kg 132.1 kg 121.5 kg    Examination:  General exam: NAD. Respiratory system: CTA B.  No wheezes, no crackles, no rhonchi.  Normal Rattray effort.  Speaking in full sentences.  Cardiovascular system: Regular rate and rhythm no murmurs rubs or gallops.  No JVD.  Trace to 1+ bilateral lower extremity edema. Gastrointestinal system: Abdomen is obese, soft, mildly distended, positive bowel sounds.  No rebound.  No guarding.  Nontender to palpation.   Central nervous system: Alert and oriented. No focal neurological deficits. Extremities: Symmetric 5 x 5 power. Skin: No rashes, lesions or ulcers Psychiatry: Judgement and insight appear normal. Mood & affect appropriate.     Data Reviewed: I have personally reviewed following labs and imaging studies  CBC: Recent Labs  Lab 07/23/21 1012 07/23/21 1342 07/24/21 0108 07/24/21 0746 07/25/21 1638 07/25/21 1730 07/26/21 0741 07/26/21 1438 07/27/21 0259  WBC 12.6*  --  12.6*  --   --   --  17.6* 15.8* 11.2*  NEUTROABS  --   --   --   --   --   --  12.1*  --   --   HGB 11.5*   < > 9.8*   < > 8.6* 7.6* 8.8* 7.9* 7.4*  HCT 35.0*   < > 29.9*   < > 25.3* 23.3* 25.2* 22.9* 22.1*  MCV 89.3  --  88.5  --   --   --  86.0 87.7 89.8  PLT 248  --  258  --   --   --  188 191 169   < > = values in this interval not displayed.     Basic Metabolic Panel: Recent Labs  Lab 07/23/21 1012 07/24/21 0108 07/25/21 0249 07/26/21 0741 07/27/21 0259 07/27/21 0659  NA 139 136 137 135 136  --   K 4.0 3.8 4.0 3.5 3.7  --   CL 110 105 110 107 109  --   CO2 23 25 22 23 24   --   GLUCOSE 173* 126* 158* 135* 111*  --   BUN 24* 15 18 15 13   --   CREATININE 1.04 0.86 0.82 0.95 0.88  --   CALCIUM 7.9* 8.1* 7.4* 7.5* 7.7*  --   MG  --   --  1.8 1.8  --  1.9     GFR: Estimated Creatinine Clearance: 118.7 mL/min (by C-G formula based on SCr of 0.88 mg/dL).  Liver Function Tests: Recent Labs  Lab 07/23/21 1012  07/24/21 0108  AST 18 18  ALT 21 19  ALKPHOS 41 36*  BILITOT 0.2* 0.8  PROT 6.4* 6.2*  ALBUMIN 3.4* 3.3*     CBG: Recent Labs  Lab 07/25/21 0745 07/25/21 1625 07/26/21 0748 07/27/21 0742  GLUCAP 178* 169* 126* 116*      Recent Results (from the past 240 hour(s))  Resp Panel by RT-PCR (Flu A&B, Covid) Nasopharyngeal Swab     Status: None   Collection Time: 07/23/21 10:59 AM   Specimen: Nasopharyngeal Swab; Nasopharyngeal(NP) swabs in vial transport medium  Result Value Ref Range Status   SARS Coronavirus 2 by RT PCR NEGATIVE NEGATIVE Final    Comment: (NOTE) SARS-CoV-2 target nucleic acids are NOT DETECTED.  The SARS-CoV-2 RNA is generally detectable in upper respiratory specimens during the acute phase of infection. The lowest concentration of SARS-CoV-2 viral copies this assay can detect is 138 copies/mL. A negative result does not preclude SARS-Cov-2 infection and should not be used as the sole basis for treatment or other patient management decisions. A negative result may occur with  improper specimen collection/handling, submission of specimen other than nasopharyngeal swab, presence of viral mutation(s) within the areas targeted by this assay, and inadequate number of viral copies(<138 copies/mL). A negative result must be combined with clinical observations, patient history, and epidemiological information. The expected result is Negative.  Fact Sheet for Patients:  EntrepreneurPulse.com.au  Fact Sheet for Healthcare Providers:  IncredibleEmployment.be  This test is no t yet approved or cleared by the [place] FDA and  has been authorized for detection and/or diagnosis of SARS-CoV-2 by FDA under an Emergency Use Authorization (EUA). This EUA will remain  in effect (meaning this test can be used) for the duration of the COVID-19 declaration under Section 564(b)(1) of the Act, 21 U.S.C.section 360bbb-3(b)(1), unless  the authorization is terminated  or revoked sooner.       Influenza A by PCR NEGATIVE NEGATIVE Final   Influenza B by PCR NEGATIVE NEGATIVE Final    Comment: (NOTE) The Xpert Xpress SARS-CoV-2/FLU/RSV plus assay is intended as an aid in the diagnosis of influenza from Nasopharyngeal swab specimens and should not be used as a sole basis for treatment. Nasal washings and aspirates are unacceptable for Xpert Xpress SARS-CoV-2/FLU/RSV testing.  Fact Sheet for Patients: EntrepreneurPulse.com.au  Fact Sheet for Healthcare Providers: IncredibleEmployment.be  This test is not yet approved or cleared by the [place] FDA and has been  authorized for detection and/or diagnosis of SARS-CoV-2 by FDA under an Emergency Use Authorization (EUA). This EUA will remain in effect (meaning this test can be used) for the duration of the COVID-19 declaration under Section 564(b)(1) of the Act, 21 U.S.C. section 360bbb-3(b)(1), unless the authorization is terminated or revoked.  Performed at [facility], [place] [address], [place], [place] [address]   MRSA Next Gen by PCR, Nasal     Status: None   Collection Time: 07/24/21  3:58 PM   Specimen: Nasal Mucosa; Nasal Swab  Result Value Ref Range Status   MRSA by PCR Next Gen NOT DETECTED NOT DETECTED Final    Comment: (NOTE) The GeneXpert MRSA Assay (FDA approved for NASAL specimens only), is one component of a comprehensive MRSA colonization surveillance program. It is not intended to diagnose MRSA infection nor to guide or monitor treatment for MRSA infections. Test performance is not FDA approved in patients less than 44 years old. Performed at [facility], [place] [address] [place], [place] [address]           Radiology Studies: DG CHEST PORT 1 VIEW  Result Date: 07/26/2021 CLINICAL DATA:  Leukocytosis EXAM: PORTABLE CHEST 1 VIEW COMPARISON:  Chest x-ray  07/23/2021 FINDINGS: Heart size and mediastinal contours are within normal limits. No suspicious pulmonary opacities identified. No pleural effusion or pneumothorax visualized. No acute osseous abnormality appreciated. IMPRESSION: No acute intrathoracic process identified. Electronically Signed   By: Ofilia Neas M.D.   On: 07/26/2021 11:21   CT Angio Abd/Pel w/ and/or w/o  Result Date: 07/25/2021 CLINICAL DATA:  Intermittent acute hematochezia with large liquid bloody bowel movement earlier this evening. EXAM: CTA ABDOMEN AND PELVIS WITHOUT AND WITH CONTRAST TECHNIQUE: Multidetector CT imaging of the abdomen and pelvis was performed using the standard protocol prior to and during bolus administration of intravenous contrast. Arterial and venous phases were performed after contrast administration with multiplanar reconstructed images and MIPs obtained and reviewed to evaluate the vascular anatomy. RADIATION DOSE REDUCTION: This exam was performed according to the departmental dose-optimization program which includes automated exposure control, adjustment of the mA and/or kV according to patient size and/or use of iterative reconstruction technique. CONTRAST:  148mL OMNIPAQUE IOHEXOL 350 MG/ML SOLN COMPARISON:  Prior CTA on 07/23/2021 FINDINGS: VASCULAR Aorta: Stable mild atherosclerosis of the abdominal aorta without evidence of aneurysm or dissection. Celiac: Normally patent. Normally patent branch vessels and branch vessel anatomy. No acute bleeding source identified in the celiac distribution on arterial or delayed phases. SMA: Normally patent. Visualized branch vessels are normally patent. No acute bleeding source identified in the SMA distribution on arterial or delayed phases. Renals: Two separate right renal arteries and a single left renal artery demonstrate normal patency. IMA: Normally patent. No acute bleeding source identified in the IMA territory. Inflow: Bilateral iliac arteries demonstrate  normal patency. Proximal Outflow: Bilateral common femoral arteries and femoral bifurcations are normally patent. Veins: Venous phase imaging demonstrates normal patency of venous structures including the IVC, hepatic veins, renal veins, iliac veins, common femoral veins, mesenteric veins, splenic vein and portal vein. Review of the MIP images confirms the above findings. NON-VASCULAR Lower chest: No acute abnormality. Hepatobiliary: Stable hepatic steatosis. No hepatic masses or biliary dilatation. Vicarious excretion of contrast in the gallbladder. Pancreas: Unremarkable. No pancreatic ductal dilatation or surrounding inflammatory changes. Spleen: Normal size.  Stable cystic region in the mid spleen. Adrenals/Urinary Tract: Normal adrenal glands. No renal masses or hydronephrosis. Stable parapelvic renal cysts  bilaterally. The bladder is unremarkable. Stomach/Bowel: Bowel shows no evidence obstruction, ileus or inflammation. Stable diverticulosis of the descending and sigmoid colon without evidence of acute diverticulitis. No active contrast extravasation into bowel lumen on arterial or venous phases of imaging. Lymphatic: No enlarged abdominal or pelvic lymph nodes identified. Reproductive: Prostate is unremarkable. Other: No hernias identified. No ascites or abnormal fluid collections. Musculoskeletal: No acute or significant osseous findings. IMPRESSION: 1. No active bleeding source identified by CTA on arterial or venous phases of imaging into the gastrointestinal tract. 2. Stable diverticulosis of descending and sigmoid colon without diverticulitis or other inflammatory process. 3. Stable hepatic steatosis. Electronically Signed   By: Aletta Edouard M.D.   On: 07/25/2021 18:45        Scheduled Meds:  sodium chloride   Intravenous Once   sodium chloride   Intravenous Once   sodium chloride   Intravenous Once   Chlorhexidine Gluconate Cloth  6 each Topical Daily   cyanocobalamin  1,000 mcg  Subcutaneous Daily   furosemide  20 mg Intravenous Once   pantoprazole (PROTONIX) IV  40 mg Intravenous Q12H   sodium chloride flush  3 mL Intravenous Q12H   Continuous Infusions:     LOS: 3 days    Time spent: 40 minutes    Irine Seal, MD [facility]   To contact the attending provider between 7A-7P or the covering provider during after hours 7P-7A, please log into the web site [email or web address] and access using universal [employer] password for that web site. If you do not have the password, please call the hospital operator.  07/27/2021, 10:16 AM

## 2021-07-27 NOTE — Telephone Encounter (Signed)
-----   Message from Milus Banister, MD sent at 07/27/2021  7:42 AM EST ----- He is probably going home later today. Will need follow up labs (cbc) in 2 weeks and OV with Dr. Loletha Carrow or extender in 5-6 weeks.   Thanks

## 2021-07-27 NOTE — Progress Notes (Signed)
°  Transition of Care [facility]) Screening Note   Patient Details  Name: Todd Osborn Date of Birth: [birth year]   Transition of Care [facility]) CM/SW Contact:    Maxx Pham, Marjie Skiff, RN Phone Number: 07/27/2021, 9:52 AM    Transition of Care Department [facility]) has reviewed patient and no TOC needs have been identified at this time. We will continue to monitor patient advancement through interdisciplinary progression rounds. If new patient transition needs arise, please place a TOC consult.

## 2021-07-28 LAB — BPAM RBC
Blood Product Expiration Date: 202302212359
Blood Product Expiration Date: 202302212359
ISSUE DATE / TIME: 202301240902
ISSUE DATE / TIME: 202301241206
Unit Type and Rh: 7300
Unit Type and Rh: 7300

## 2021-07-28 LAB — TYPE AND SCREEN
ABO/RH(D): B POS
Antibody Screen: NEGATIVE
Unit division: 0
Unit division: 0

## 2021-07-30 ENCOUNTER — Ambulatory Visit: Payer: BC Managed Care – PPO | Admitting: Nurse Practitioner

## 2021-07-30 ENCOUNTER — Other Ambulatory Visit: Payer: Self-pay

## 2021-07-30 ENCOUNTER — Encounter: Payer: Self-pay | Admitting: Nurse Practitioner

## 2021-07-30 VITALS — BP 127/71 | HR 61 | Temp 98.1°F | Ht 72.0 in | Wt 293.0 lb

## 2021-07-30 DIAGNOSIS — Z0000 Encounter for general adult medical examination without abnormal findings: Secondary | ICD-10-CM | POA: Diagnosis not present

## 2021-07-30 DIAGNOSIS — E119 Type 2 diabetes mellitus without complications: Secondary | ICD-10-CM | POA: Diagnosis not present

## 2021-07-30 DIAGNOSIS — K922 Gastrointestinal hemorrhage, unspecified: Secondary | ICD-10-CM | POA: Diagnosis not present

## 2021-07-30 LAB — POCT GLYCOSYLATED HEMOGLOBIN (HGB A1C)
HbA1c POC (<> result, manual entry): 7.8 % (ref 4.0–5.6)
HbA1c, POC (controlled diabetic range): 7.8 % — AB (ref 0.0–7.0)
HbA1c, POC (prediabetic range): 7.8 % — AB (ref 5.7–6.4)
Hemoglobin A1C: 7.8 % — AB (ref 4.0–5.6)

## 2021-07-30 LAB — GLUCOSE, POCT (MANUAL RESULT ENTRY): POC Glucose: 127 mg/dl — AB (ref 70–99)

## 2021-07-30 MED ORDER — METFORMIN HCL 500 MG PO TABS
500.0000 mg | ORAL_TABLET | Freq: Three times a day (TID) | ORAL | 2 refills | Status: DC
  Filled 2021-07-30: qty 90, 30d supply, fill #0

## 2021-07-30 MED ORDER — PANTOPRAZOLE SODIUM 40 MG PO TBEC
40.0000 mg | DELAYED_RELEASE_TABLET | Freq: Every day | ORAL | 1 refills | Status: DC
  Filled 2021-07-30 – 2021-08-02 (×3): qty 30, 30d supply, fill #0

## 2021-07-30 MED ORDER — PANTOPRAZOLE SODIUM 40 MG PO TBEC: 40.0000 mg | DELAYED_RELEASE_TABLET | Freq: Every day | ORAL | 1 refills | Status: DC

## 2021-07-30 NOTE — Progress Notes (Signed)
[name], [name]  [address] Phone:  [phone]   Fax:  [phone] Subjective:   Patient ID: Todd Osborn, male    DOB: 1961-03-05, 61 y.o.   MRN: [id number]  Chief Complaint  Patient presents with   Establish Care    Pt is here to establish care a follow up for his hospital visit. Pt states that he was admitted to the hospital on 07/23/21.   HPI Todd Osborn 61 y.o. 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 To the [facility] for hospital follow up and to establish care.   Was admitted to the hospital 1 wk ago, 07/23/21, for GI bleed. States that he went to the ED via EMS after noting large amount of rectal bleeding after bowel movement x 2 and having abdominal pain. States that blood was dark red to black in color. Also endorsed having some lightheadedness and weakness. Since discharge has not had any rectal bleeding, dizziness or lightheadedness. States that during admission he received 8 units of blood via transfusion.  States that he has not been able to take discharge medications, they have not been filled by pharmacy. Currently monitors meals and diet. Does not currently exercise. Works as a Transport planner. Last visit with PCP several years ago. Denies any other complaints today.  Denies any fever. Denies any fatigue, chest pain, shortness of breath, HA or dizziness. Denies any blurred vision, numbness or tingling.  Past Medical History:  Diagnosis Date   Colon abnormality Positive COLOGUARD **POSITIVE** 03/16/2017   History of cellulitis    Hypertension     History reviewed. No pertinent surgical history.  Family History  Problem Relation Age of Onset   Cancer Mother    Colon cancer Mother 26   Cancer Father    Colon cancer Father 50   Esophageal cancer Neg Hx    Rectal cancer Neg Hx    Stomach cancer Neg Hx      Social History   Socioeconomic History   Marital status: Married    Spouse name: Not on file   Number of children: Not on file   Years of education: Not on file   Highest education level: Not on file  Occupational History   Not on file  Tobacco Use   Smoking status: Former    Types: Cigarettes    Quit date: 07/04/1986    Years since quitting: 35.1   Smokeless tobacco: Never  Vaping Use   Vaping Use: Never used  Substance and Sexual Activity   Alcohol use: No    Comment: occasional   Drug use: No    Frequency: 4.0 times per week    Types: Marijuana    Comment: occasional   Sexual activity: Not Currently  Other Topics Concern   Not on file  Social History Narrative   Not on file   Social Determinants of Health   Financial Resource Strain: Not on file  Food Insecurity: Not on file  Transportation Needs: Not on file  Physical Activity: Not on file  Stress: Not on file  Social Connections: Not on file  Intimate Partner Violence: Not on file    Outpatient Medications Prior to Visit  Medication Sig Dispense Refill   acetaminophen (TYLENOL) 500 MG tablet Take 1 tablet (500 mg total) by mouth every 6 (six) hours as needed. 30 tablet 0   vitamin B-12 (  CYANOCOBALAMIN) 1000 MCG tablet Take 1 tablet (1,000 mcg total) by mouth daily.     pantoprazole (PROTONIX) 40 MG tablet Take 1 tablet (40 mg total) by mouth daily. (Patient not taking: Reported on 07/30/2021) 30 tablet 1   No facility-administered medications prior to visit.    Allergies  Allergen Reactions   Amlodipine     Nosebleed    Losartan     Nosebleed    Penicillins Hives    Has patient had a PCN reaction causing immediate rash, facial/tongue/throat swelling, SOB or lightheadedness with hypotension: No Has patient had a PCN reaction causing severe rash involving mucus membranes or skin necrosis: No Has patient had a PCN reaction that required hospitalization: No Has patient had a PCN reaction occurring  within the last 10 years: No If all of the above answers are "NO", then may proceed with Cephalosporin use. Pt tolerated Keflex in April 2018    Review of Systems  Constitutional:  Negative for chills, fever and malaise/fatigue.  HENT: Negative.    Eyes: Negative.   Respiratory:  Negative for cough and shortness of breath.   Cardiovascular:  Negative for chest pain, palpitations and leg swelling.  Gastrointestinal:  Positive for abdominal pain and blood in stool. Negative for constipation, diarrhea, heartburn, melena, nausea and vomiting.  Genitourinary: Negative.   Musculoskeletal: Negative.   Skin: Negative.   Neurological: Negative.   Psychiatric/Behavioral:  Negative for depression. The patient is not nervous/anxious.   All other systems reviewed and are negative.     Objective:    Physical Exam Vitals reviewed.  Constitutional:      General: He is not in acute distress.    Appearance: Normal appearance. He is obese.  HENT:     Head: Normocephalic.     Right Ear: Tympanic membrane, ear canal and external ear normal.     Left Ear: Tympanic membrane, ear canal and external ear normal.     Nose: Nose normal.     Mouth/Throat:     Mouth: Mucous membranes are moist.     Pharynx: Oropharynx is clear.  Eyes:     Extraocular Movements: Extraocular movements intact.     Conjunctiva/sclera: Conjunctivae normal.     Pupils: Pupils are equal, round, and reactive to light.  Neck:     Vascular: No carotid bruit.  Cardiovascular:     Rate and Rhythm: Normal rate and regular rhythm.     Pulses: Normal pulses.     Heart sounds: Normal heart sounds.     Comments: No obvious peripheral edema Pulmonary:     Effort: Pulmonary effort is normal.     Breath sounds: Normal breath sounds.  Abdominal:     General: Abdomen is flat. Bowel sounds are normal. There is no distension.     Palpations: Abdomen is soft. There is no mass.     Tenderness: There is no abdominal tenderness. There is  no right CVA tenderness, left CVA tenderness, guarding or rebound.     Hernia: No hernia is present.  Musculoskeletal:        General: No swelling, tenderness, deformity or signs of injury. Normal range of motion.     Cervical back: Normal range of motion and neck supple. No rigidity or tenderness.     Right lower leg: No edema.     Left lower leg: No edema.  Lymphadenopathy:     Cervical: No cervical adenopathy.  Skin:    General: Skin is warm and dry.  Capillary Refill: Capillary refill takes less than 2 seconds.  Neurological:     General: No focal deficit present.     Mental Status: He is alert and oriented to person, place, and time.  Psychiatric:        Mood and Affect: Mood normal.        Behavior: Behavior normal.        Thought Content: Thought content normal.        Judgment: Judgment normal.    BP 127/71    Pulse 61    Temp 98.1 F (36.7 C)    Ht 6' (1.829 m)    Wt 293 lb (132.9 kg)    SpO2 99%    BMI 39.74 kg/m  Wt Readings from Last 3 Encounters:  07/30/21 293 lb (132.9 kg)  07/27/21 267 lb 13.7 oz (121.5 kg)  01/15/19 285 lb (129.3 kg)     There is no immunization history on file for this patient.  Diabetic Foot Exam - Simple   No data filed     Lab Results  Component Value Date   TSH 1.07 02/20/2017   Lab Results  Component Value Date   WBC 9.1 07/30/2021   HGB 10.8 (L) 07/30/2021   HCT 32.3 (L) 07/30/2021   MCV 89 07/30/2021   PLT 301 07/30/2021   Lab Results  Component Value Date   NA 142 07/30/2021   K 4.6 07/30/2021   CO2 24 07/30/2021   GLUCOSE 110 (H) 07/30/2021   BUN 12 07/30/2021   CREATININE 0.96 07/30/2021   BILITOT 0.2 07/30/2021   ALKPHOS 49 07/30/2021   AST 29 07/30/2021   ALT 44 07/30/2021   PROT 5.7 (L) 07/30/2021   ALBUMIN 3.5 (L) 07/30/2021   CALCIUM 8.3 (L) 07/30/2021   ANIONGAP 3 (L) 07/27/2021   EGFR 90 07/30/2021   Lab Results  Component Value Date   CHOL 143 07/30/2021   Lab Results  Component Value Date    HDL 39 (L) 07/30/2021   Lab Results  Component Value Date   LDLCALC 87 07/30/2021   Lab Results  Component Value Date   TRIG 89 07/30/2021   Lab Results  Component Value Date   CHOLHDL 3.7 07/30/2021   Lab Results  Component Value Date   HGBA1C 7.8 (A) 07/30/2021   HGBA1C 7.8 07/30/2021   HGBA1C 7.8 (A) 07/30/2021   HGBA1C 7.8 (A) 07/30/2021       Assessment & Plan:   Problem List Items Addressed This Visit       Digestive   GIB (gastrointestinal bleeding)   Relevant Medications   pantoprazole (PROTONIX) 40 MG tablet   Other Relevant Orders   CBC with Differential/Platelet (Completed)   Comprehensive metabolic panel (Completed)   Lipid panel (Completed)   Magnesium (Completed)   Vitamin B12 (Completed) Encouraged taking medications as prescribed  Given anticipatory guidance and return precautions  Maintain follow up with GI in 1 mth    Other Visit Diagnoses     Healthcare maintenance    -  Primary   Relevant Orders   HgB A1c (Completed)   Glucose (CBG) (Completed)   CBC with Differential/Platelet (Completed)   Comprehensive metabolic panel (Completed)   Lipid panel (Completed)   Magnesium (Completed)   Vitamin B12 (Completed) Encouraged continued diet and exercise efforts  Encouraged continued compliance with medication     Well adult exam       Relevant Orders   CBC with Differential/Platelet (Completed)   Comprehensive  metabolic panel (Completed)   Lipid panel (Completed)   Magnesium (Completed)   Vitamin B12 (Completed)   PSA (Completed)   Type 2 diabetes mellitus without complication, without long-term current use of insulin (HCC)       Relevant Medications   metFORMIN (GLUCOPHAGE) 500 MG tablet Discussed diet and exercise options at length    Follow up in 1 mth for repeat labs for anemia and  3 mths for reevaluation of diabetes, sooner as needed     I am having Todd Osborn start on metFORMIN. I am also having him maintain his  acetaminophen, vitamin B-12, and pantoprazole.  Meds ordered this encounter  Medications   DISCONTD: pantoprazole (PROTONIX) 40 MG tablet    Sig: Take 1 tablet (40 mg total) by mouth daily.    Dispense:  30 tablet    Refill:  1   pantoprazole (PROTONIX) 40 MG tablet    Sig: Take 1 tablet (40 mg total) by mouth daily.    Dispense:  30 tablet    Refill:  1   metFORMIN (GLUCOPHAGE) 500 MG tablet    Sig: Take 1 tablet (500 mg total) by mouth 3 (three) times daily before meals.    Dispense:  90 tablet    Refill:  2     Teena Dunk, NP

## 2021-07-30 NOTE — Patient Instructions (Signed)
You were seen today in the [facility] for hospital follow up and wellness visit. Labs were collected, results will be available via MyChart or, if abnormal, you will be contacted by clinic staff. You were prescribed medications, please take as directed. Please follow up in 6 mths for reevaluation and wellness visit.

## 2021-07-31 LAB — CBC WITH DIFFERENTIAL/PLATELET
Basophils Absolute: 0.1 10*3/uL (ref 0.0–0.2)
Basos: 1 %
EOS (ABSOLUTE): 0.5 10*3/uL — ABNORMAL HIGH (ref 0.0–0.4)
Eos: 6 %
Hematocrit: 32.3 % — ABNORMAL LOW (ref 37.5–51.0)
Hemoglobin: 10.8 g/dL — ABNORMAL LOW (ref 13.0–17.7)
Immature Grans (Abs): 0 10*3/uL (ref 0.0–0.1)
Immature Granulocytes: 0 %
Lymphocytes Absolute: 1.5 10*3/uL (ref 0.7–3.1)
Lymphs: 17 %
MCH: 29.6 pg (ref 26.6–33.0)
MCHC: 33.4 g/dL (ref 31.5–35.7)
MCV: 89 fL (ref 79–97)
Monocytes Absolute: 0.7 10*3/uL (ref 0.1–0.9)
Monocytes: 8 %
Neutrophils Absolute: 6.2 10*3/uL (ref 1.4–7.0)
Neutrophils: 68 %
Platelets: 301 10*3/uL (ref 150–450)
RBC: 3.65 x10E6/uL — ABNORMAL LOW (ref 4.14–5.80)
RDW: 14.7 % (ref 11.6–15.4)
WBC: 9.1 10*3/uL (ref 3.4–10.8)

## 2021-07-31 LAB — PSA: Prostate Specific Ag, Serum: 2 ng/mL (ref 0.0–4.0)

## 2021-07-31 LAB — MAGNESIUM: Magnesium: 2.2 mg/dL (ref 1.6–2.3)

## 2021-07-31 LAB — COMPREHENSIVE METABOLIC PANEL
ALT: 44 IU/L (ref 0–44)
AST: 29 IU/L (ref 0–40)
Albumin/Globulin Ratio: 1.6 (ref 1.2–2.2)
Albumin: 3.5 g/dL — ABNORMAL LOW (ref 3.8–4.8)
Alkaline Phosphatase: 49 IU/L (ref 44–121)
BUN/Creatinine Ratio: 13 (ref 10–24)
BUN: 12 mg/dL (ref 8–27)
Bilirubin Total: 0.2 mg/dL (ref 0.0–1.2)
CO2: 24 mmol/L (ref 20–29)
Calcium: 8.3 mg/dL — ABNORMAL LOW (ref 8.6–10.2)
Chloride: 107 mmol/L — ABNORMAL HIGH (ref 96–106)
Creatinine, Ser: 0.96 mg/dL (ref 0.76–1.27)
Globulin, Total: 2.2 g/dL (ref 1.5–4.5)
Glucose: 110 mg/dL — ABNORMAL HIGH (ref 70–99)
Potassium: 4.6 mmol/L (ref 3.5–5.2)
Sodium: 142 mmol/L (ref 134–144)
Total Protein: 5.7 g/dL — ABNORMAL LOW (ref 6.0–8.5)
eGFR: 90 mL/min/{1.73_m2} (ref >59)

## 2021-07-31 LAB — LIPID PANEL
Chol/HDL Ratio: 3.7 ratio (ref 0.0–5.0)
Cholesterol, Total: 143 mg/dL (ref 100–199)
HDL: 39 mg/dL — ABNORMAL LOW (ref >39)
LDL Chol Calc (NIH): 87 mg/dL (ref 0–99)
Triglycerides: 89 mg/dL (ref 0–149)
VLDL Cholesterol Cal: 17 mg/dL (ref 5–40)

## 2021-07-31 LAB — VITAMIN B12: Vitamin B-12: 828 pg/mL (ref 232–1245)

## 2021-08-02 ENCOUNTER — Other Ambulatory Visit: Payer: Self-pay

## 2021-08-03 ENCOUNTER — Other Ambulatory Visit: Payer: Self-pay

## 2021-08-03 DIAGNOSIS — K922 Gastrointestinal hemorrhage, unspecified: Secondary | ICD-10-CM

## 2021-08-03 MED ORDER — IRON 325 (65 FE) MG PO TABS: 1.0000 | ORAL_TABLET | Freq: Every day | ORAL | 0 refills | Status: DC

## 2021-08-03 NOTE — Progress Notes (Signed)
Patient notified of improved lab work per Dr Ardis Hughs.  Patient reminded of appointment scheduled for 09-03-21 with Dr Loletha Carrow and that he will need to have lab work a few days prior to appointment.  Patient instructed that he should start taking an Iron supplement daily.  He was advised to report to nearest ED if he started bleeding again.  Patient agreed to plan and verbalized understanding.  No further questions.

## 2021-08-30 ENCOUNTER — Other Ambulatory Visit (INDEPENDENT_AMBULATORY_CARE_PROVIDER_SITE_OTHER): Payer: BC Managed Care – PPO

## 2021-08-30 DIAGNOSIS — K922 Gastrointestinal hemorrhage, unspecified: Secondary | ICD-10-CM | POA: Diagnosis not present

## 2021-08-30 LAB — CBC
HCT: 37.5 % — ABNORMAL LOW (ref 39.0–52.0)
Hemoglobin: 12.5 g/dL — ABNORMAL LOW (ref 13.0–17.0)
MCHC: 33.3 g/dL (ref 30.0–36.0)
MCV: 86.6 fl (ref 78.0–100.0)
Platelets: 261 10*3/uL (ref 150.0–400.0)
RBC: 4.32 Mil/uL (ref 4.22–5.81)
RDW: 13.9 % (ref 11.5–15.5)
WBC: 7.6 10*3/uL (ref 4.0–10.5)

## 2021-09-03 ENCOUNTER — Ambulatory Visit: Payer: BC Managed Care – PPO | Admitting: Gastroenterology

## 2021-09-03 ENCOUNTER — Encounter: Payer: Self-pay | Admitting: Gastroenterology

## 2021-09-03 VITALS — BP 128/64 | HR 70 | Ht 72.0 in | Wt 272.0 lb

## 2021-09-03 DIAGNOSIS — K5731 Diverticulosis of large intestine without perforation or abscess with bleeding: Secondary | ICD-10-CM

## 2021-09-03 DIAGNOSIS — D62 Acute posthemorrhagic anemia: Secondary | ICD-10-CM | POA: Diagnosis not present

## 2021-09-03 NOTE — Patient Instructions (Addendum)
If you are age 62 or older, your body mass index should be between 23-30. Your Body mass index is 36.89 kg/m?[name] If this is out of the aforementioned range listed, please consider follow up with your Primary Care Provider. ? ?If you are age 62 or younger, your body mass index should be between 19-25. Your Body mass index is 36.89 kg/m?[name] If this is out of the aformentioned range listed, please consider follow up with your Primary Care Provider.  ? ?________________________________________________________ ? ?The  GI providers would like to encourage you to use [facility] to communicate with providers for non-urgent requests or questions.  Due to long hold times on the telephone, sending your provider a message by [facility] may be a faster and more efficient way to get a response.  Please allow 48 business hours for a response.  Please remember that this is for non-urgent requests.  ?_______________________________________________________ ? ?It was a pleasure to see you today! ? ?Thank you for trusting me with your gastrointestinal care!   ? ? ? ? ?

## 2021-09-03 NOTE — Progress Notes (Signed)
? ? ?[facility] progress note: ? ?History: ?Todd Osborn ?09/03/2021 ? ?Referring provider: Teena Dunk, NP ? ?Reason for consult/chief complaint: GI Bleeding (Patient was in hospital for rectal bleeding. Patient states his bleeding resolved after hospital stay. Denies any other GI sx at this time.) ? ? ?Subjective  ?HPI: ?I last saw Todd Osborn for a colonoscopy in November 2018, and exam which was done for a positive Cologuard.  He had 2 diminutive right colon adenomas and 2 left colon hyperplastic polyps.  Recall recommendation was 5 years (reported family history of colon cancer in both parents).  On that exam, he also had pandiverticulosis and internal hemorrhoids. ? ?Todd Osborn was admitted to our hospital in late January for large volume hematochezia with anemia of acute blood loss, suspected to been diverticular.  It was treated conservatively, and required 8 units of PRBC transfusion.  2 negative CT angiograms during that admission. ? ?Todd Osborn was discharged on twice daily Protonix and iron sulfate, but he has not been taking either them.  He takes a multivitamin with iron and has gotten his blood counts checked regularly as noted below.  He is glad to report that he feels well, his energy level is improved, he has no abdominal pain or recurrent bleeding, and denies chest pain dyspnea or dysuria.  He also changed his diet to get rid of sugar and soft drinks and has lost about 15 pounds. ? ? ? ?Past Medical History: ?Past Medical History:  ?Diagnosis Date  ? Colon abnormality Positive COLOGUARD **POSITIVE** 03/16/2017  ? History of cellulitis   ? Hypertension   ? ? ? ?Past Surgical History: ?History reviewed. No pertinent surgical history. ? ? ?Family History: ?Family History  ?Problem Relation Age of Onset  ? Cancer Mother   ? Colon cancer Mother 73  ? Cancer Father   ? Colon cancer Father 37  ? Esophageal cancer Neg Hx   ? Rectal cancer Neg Hx   ? Stomach cancer Neg Hx   ? ? ?Social  History: ?Social History  ? ?Socioeconomic History  ? Marital status: Married  ?  Spouse name: Not on file  ? Number of children: Not on file  ? Years of education: Not on file  ? Highest education level: Not on file  ?Occupational History  ? Not on file  ?Tobacco Use  ? Smoking status: Former  ?  Types: Cigarettes  ?  Quit date: 07/04/1986  ?  Years since quitting: 35.1  ? Smokeless tobacco: Never  ?Vaping Use  ? Vaping Use: Never used  ?Substance and Sexual Activity  ? Alcohol use: No  ?  Comment: occasional  ? Drug use: No  ?  Frequency: 4.0 times per week  ?  Types: Marijuana  ?  Comment: occasional  ? Sexual activity: Not Currently  ?Other Topics Concern  ? Not on file  ?Social History Narrative  ? Not on file  ? ?Social Determinants of Health  ? ?Financial Resource Strain: Not on file  ?Food Insecurity: Not on file  ?Transportation Needs: Not on file  ?Physical Activity: Not on file  ?Stress: Not on file  ?Social Connections: Not on file  ? ? ?Allergies: ?Allergies  ?Allergen Reactions  ? Amlodipine   ?  Nosebleed   ? Losartan   ?  Nosebleed   ? Penicillins Hives  ?  Has patient had a PCN reaction causing immediate rash, facial/tongue/throat swelling, SOB or lightheadedness with hypotension: No ?Has patient had a PCN reaction  causing severe rash involving mucus membranes or skin necrosis: No ?Has patient had a PCN reaction that required hospitalization: No ?Has patient had a PCN reaction occurring within the last 10 years: No ?If all of the above answers are "NO", then may proceed with Cephalosporin use. ?Pt tolerated Keflex in April 2018  ? ? ?Outpatient Meds: ?Current Outpatient Medications  ?Medication Sig Dispense Refill  ? acetaminophen (TYLENOL) 500 MG tablet Take 1 tablet (500 mg total) by mouth every 6 (six) hours as needed. 30 tablet 0  ? metFORMIN (GLUCOPHAGE) 500 MG tablet Take 1 tablet (500 mg total) by mouth 3 (three) times daily before meals. 90 tablet 2  ? Multiple Vitamins-Iron (MULTIVITAMIN/IRON  PO) Take 1 capsule by mouth daily.    ? ?No current facility-administered medications for this visit.  ? ? ? ? ?___________________________________________________________________ ?Objective  ? ?Exam: ? ?BP 128/64   Pulse 70   Ht 6' (1.829 m)   Wt 272 lb (123.4 kg)   BMI 36.89 kg/m?  ?Wt Readings from Last 3 Encounters:  ?09/03/21 272 lb (123.4 kg)  ?07/30/21 293 lb (132.9 kg)  ?07/27/21 267 lb 13.7 oz (121.5 kg)  ? ? ?Looks well. ?No exam, entire visit spent in record review and discussion ? ?Labs: ? ?CBC Latest Ref Rng & Units 08/30/2021 07/30/2021 07/27/2021  ?WBC 4.0 - 10.5 K/uL 7.6 9.1 -  ?Hemoglobin 13.0 - 17.0 g/dL 12.5(L) 10.8(L) 10.2(L)  ?Hematocrit 39.0 - 52.0 % 37.5(L) 32.3(L) 29.8(L)  ?Platelets 150.0 - 400.0 K/uL 261.0 301 -  ? ? ? ?Radiologic Studies: ? ?CT angiogram x2 negative for bleeding as noted above ? ?Assessment: ?Encounter Diagnoses  ?Name Primary?  ? Diverticulosis of colon with hemorrhage Yes  ? Acute blood loss anemia   ? ? ?Todd Osborn recently had a first episode of diverticular bleeding that was severe with substantial blood loss requiring 8 units of PRBC transfusion.  Fortunately, bleeding stopped and he has been well since then.  His anemia has almost completely recovered. ? ?Plan: ? ?No further testing or treatment at this point, he will see me in the office as needed.  If he has recurrent bleeding like what occurred recently, he knows to proceed directly to the emergency department. ? ? ?23 minutes were spent on this encounter (including chart review, history/exam, counseling/coordination of care, and documentation) > 50% of that time was spent on counseling and coordination of care. ? ? ?Todd Osborn ? ?CC: Referring provider noted above ? ?

## 2022-01-28 ENCOUNTER — Ambulatory Visit: Payer: BC Managed Care – PPO | Admitting: Nurse Practitioner

## 2022-01-28 ENCOUNTER — Encounter: Payer: Self-pay | Admitting: Nurse Practitioner

## 2022-01-28 VITALS — BP 137/74 | HR 60 | Temp 97.6°F | Ht 72.0 in | Wt 260.5 lb

## 2022-01-28 DIAGNOSIS — E119 Type 2 diabetes mellitus without complications: Secondary | ICD-10-CM | POA: Diagnosis not present

## 2022-01-28 DIAGNOSIS — E559 Vitamin D deficiency, unspecified: Secondary | ICD-10-CM | POA: Diagnosis not present

## 2022-01-28 NOTE — Assessment & Plan Note (Signed)
-   Hemoglobin A1c - CBC - Comprehensive metabolic panel - Lipid Panel  2. Vitamin D deficiency  - Vitamin D, 25-hydroxy  Follow up:  Follow up in 3 months

## 2022-01-28 NOTE — Patient Instructions (Addendum)
1. Type 2 diabetes mellitus without complication, without long-term current use of insulin (HCC)  - Hemoglobin A1c - CBC - Comprehensive metabolic panel - Lipid Panel    2. Vitamin D deficiency  - Vitamin D, 25-hydroxy    Follow up:  Follow up in 3 months

## 2022-01-28 NOTE — Progress Notes (Signed)
$'@Patient'b$  ID: Todd Osborn, male    DOB: 23-Jun-1961, 61 y.o.   MRN: [id number]  Chief Complaint  Patient presents with   Follow-up    Pt is here for 6 month's follow up visit.    Referring provider: No ref. provider found   HPI  Todd Osborn 61 y.o. 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 To the [facility] for hospital follow up and to establish care.     Patient presents for a diabetes follow up today. Overall doing well. No new issues or concerns. Complaint with medications, diet, and exercise. Denies f/c/s, n/v/d, hemoptysis, PND, leg swelling Denies chest pain or edema    Allergies  Allergen Reactions   Amlodipine     Nosebleed    Losartan     Nosebleed    Penicillins Hives    Has patient had a PCN reaction causing immediate rash, facial/tongue/throat swelling, SOB or lightheadedness with hypotension: No Has patient had a PCN reaction causing severe rash involving mucus membranes or skin necrosis: No Has patient had a PCN reaction that required hospitalization: No Has patient had a PCN reaction occurring within the last 10 years: No If all of the above answers are "NO", then may proceed with Cephalosporin use. Pt tolerated Keflex in April 2018     There is no immunization history on file for this patient.  Past Medical History:  Diagnosis Date   Colon abnormality Positive COLOGUARD **POSITIVE** 03/16/2017   History of cellulitis    Hypertension     Tobacco History: Social History   Tobacco Use  Smoking Status Former   Types: Cigarettes   Quit date: 07/04/1986   Years since quitting: 35.7  Smokeless Tobacco Never   Counseling given: Not Answered   Outpatient Encounter Medications as of 01/28/2022  Medication Sig   acetaminophen (TYLENOL) 500 MG tablet Take 1 tablet (500 mg total) by mouth every 6 (six) hours as needed.   Multiple Vitamins-Iron (MULTIVITAMIN/IRON  PO) Take 1 capsule by mouth daily.   [DISCONTINUED] metFORMIN (GLUCOPHAGE) 500 MG tablet Take 1 tablet (500 mg total) by mouth 3 (three) times daily before meals.   No facility-administered encounter medications on file as of 01/28/2022.     Review of Systems  Review of Systems  Constitutional: Negative.   HENT: Negative.    Cardiovascular: Negative.   Gastrointestinal: Negative.   Allergic/Immunologic: Negative.   Neurological: Negative.   Psychiatric/Behavioral: Negative.         Physical Exam  BP 137/74 (BP Location: Left Arm, Patient Position: Sitting, Cuff Size: Large)   Pulse 60   Temp 97.6 F (36.4 C)   Ht 6' (1.829 m)   Wt 260 lb 8 oz (118.2 kg)   SpO2 100%   BMI 35.33 kg/m   Wt Readings from Last 5 Encounters:  01/28/22 260 lb 8 oz (118.2 kg)  09/03/21 272 lb (123.4 kg)  07/30/21 293 lb (132.9 kg)  07/27/21 267 lb 13.7 oz (121.5 kg)  01/15/19 285 lb (129.3 kg)     Physical Exam Vitals and nursing note reviewed.  Constitutional:      General: He is not in acute distress.    Appearance: He is well-developed.  Cardiovascular:     Rate and Rhythm: Normal rate and regular rhythm.  Pulmonary:     Effort: Pulmonary effort is normal.     Breath sounds: Normal breath sounds.  Skin:  General: Skin is warm and dry.  Neurological:     Mental Status: He is alert and oriented to person, place, and time.      Lab Results:  CBC    Component Value Date/Time   WBC 9.3 01/28/2022 1446   WBC 7.6 08/30/2021 1222   RBC 5.08 01/28/2022 1446   RBC 4.32 08/30/2021 1222   HGB 14.2 01/28/2022 1446   HCT 43.6 01/28/2022 1446   PLT 280 01/28/2022 1446   MCV 86 01/28/2022 1446   MCH 28.0 01/28/2022 1446   MCH 30.1 07/27/2021 0259   MCHC 32.6 01/28/2022 1446   MCHC 33.3 08/30/2021 1222   RDW 13.9 01/28/2022 1446   LYMPHSABS 1.5 07/30/2021 1002   MONOABS 1.7 (H) 07/26/2021 0741   EOSABS 0.5 (H) 07/30/2021 1002   BASOSABS 0.1 07/30/2021 1002    BMET     Component Value Date/Time   NA 143 01/28/2022 1446   K 4.6 01/28/2022 1446   CL 104 01/28/2022 1446   CO2 22 01/28/2022 1446   GLUCOSE 92 01/28/2022 1446   GLUCOSE 111 (H) 07/27/2021 0259   BUN 16 01/28/2022 1446   CREATININE 0.87 01/28/2022 1446   CREATININE 0.86 02/20/2017 1524   CALCIUM 9.2 01/28/2022 1446   GFRNONAA >60 07/27/2021 0259   GFRNONAA >89 02/20/2017 1524   GFRAA >89 02/20/2017 1524    BNP No results found for: "BNP"  ProBNP No results found for: "PROBNP"  Imaging: No results found.   Assessment & Plan:   Type 2 diabetes mellitus without complication, without long-term current use of insulin (HCC) - Hemoglobin A1c - CBC - Comprehensive metabolic panel - Lipid Panel  2. Vitamin D deficiency  - Vitamin D, 25-hydroxy  Follow up:  Follow up in 3 months   Fenton Foy, NP 03/18/2022

## 2022-01-29 LAB — CBC
Hematocrit: 43.6 % (ref 37.5–51.0)
Hemoglobin: 14.2 g/dL (ref 13.0–17.7)
MCH: 28 pg (ref 26.6–33.0)
MCHC: 32.6 g/dL (ref 31.5–35.7)
MCV: 86 fL (ref 79–97)
Platelets: 280 10*3/uL (ref 150–450)
RBC: 5.08 x10E6/uL (ref 4.14–5.80)
RDW: 13.9 % (ref 11.6–15.4)
WBC: 9.3 10*3/uL (ref 3.4–10.8)

## 2022-01-29 LAB — COMPREHENSIVE METABOLIC PANEL
ALT: 15 IU/L (ref 0–44)
AST: 15 IU/L (ref 0–40)
Albumin/Globulin Ratio: 1.5 (ref 1.2–2.2)
Albumin: 4.4 g/dL (ref 3.9–4.9)
Alkaline Phosphatase: 58 IU/L (ref 44–121)
BUN/Creatinine Ratio: 18 (ref 10–24)
BUN: 16 mg/dL (ref 8–27)
Bilirubin Total: 0.4 mg/dL (ref 0.0–1.2)
CO2: 22 mmol/L (ref 20–29)
Calcium: 9.2 mg/dL (ref 8.6–10.2)
Chloride: 104 mmol/L (ref 96–106)
Creatinine, Ser: 0.87 mg/dL (ref 0.76–1.27)
Globulin, Total: 3 g/dL (ref 1.5–4.5)
Glucose: 92 mg/dL (ref 70–99)
Potassium: 4.6 mmol/L (ref 3.5–5.2)
Sodium: 143 mmol/L (ref 134–144)
Total Protein: 7.4 g/dL (ref 6.0–8.5)
eGFR: 98 mL/min/{1.73_m2} (ref >59)

## 2022-01-29 LAB — LIPID PANEL
Chol/HDL Ratio: 4.2 ratio (ref 0.0–5.0)
Cholesterol, Total: 172 mg/dL (ref 100–199)
HDL: 41 mg/dL (ref >39)
LDL Chol Calc (NIH): 118 mg/dL — ABNORMAL HIGH (ref 0–99)
Triglycerides: 70 mg/dL (ref 0–149)
VLDL Cholesterol Cal: 13 mg/dL (ref 5–40)

## 2022-01-29 LAB — VITAMIN D 25 HYDROXY (VIT D DEFICIENCY, FRACTURES): Vit D, 25-Hydroxy: 23.9 ng/mL — ABNORMAL LOW (ref 30.0–100.0)

## 2022-01-29 LAB — HEMOGLOBIN A1C
Est. average glucose Bld gHb Est-mCnc: 126 mg/dL
Hgb A1c MFr Bld: 6 % — ABNORMAL HIGH (ref 4.8–5.6)

## 2022-03-18 ENCOUNTER — Encounter: Payer: Self-pay | Admitting: Nurse Practitioner

## 2022-05-04 ENCOUNTER — Encounter: Payer: Self-pay | Admitting: Gastroenterology

## 2022-06-06 ENCOUNTER — Encounter: Payer: Self-pay | Admitting: Gastroenterology

## 2022-07-08 ENCOUNTER — Telehealth: Payer: Self-pay | Admitting: *Deleted

## 2022-07-08 ENCOUNTER — Ambulatory Visit: Payer: BC Managed Care – PPO

## 2022-07-08 NOTE — Progress Notes (Unsigned)
No egg or soy allergy known to patient  No issues known to pt with past sedation with any surgeries or procedures Patient denies ever being told they had issues or difficulty with intubation  No FH of Malignant Hyperthermia Pt is not on diet pills Pt is not on  home 02  Pt is not on blood thinners  Pt denies issues with constipation  Pt is not on dialysis Pt denies any upcoming cardiac testing Pt encouraged to use to use Singlecare or Goodrx to reduce cost Patient's chart reviewed by Osvaldo Angst CNRA prior to previsit and patient appropriate for the [place].  Previsit completed and red dot placed by patient's name on their procedure day (on provider's schedule).  . Visit by phone Instructions sent by mail

## 2022-07-08 NOTE — Telephone Encounter (Signed)
1st attempt LM with pt and gave # for call back 2nd attempt  5 minutes later for call to pt. Unable to reach LM with # forr call back again and informed pt that he has till 5 pm to call back or procedure will be canceled.

## 2022-07-20 ENCOUNTER — Ambulatory Visit (AMBULATORY_SURGERY_CENTER): Payer: BC Managed Care – PPO

## 2022-07-20 VITALS — Ht 72.0 in | Wt 260.0 lb

## 2022-07-20 DIAGNOSIS — Z86010 Personal history of colonic polyps: Secondary | ICD-10-CM

## 2022-07-20 DIAGNOSIS — Z800 Family history of malignant neoplasm of digestive organs: Secondary | ICD-10-CM

## 2022-07-20 MED ORDER — NA SULFATE-K SULFATE-MG SULF 17.5-3.13-1.6 GM/177ML PO SOLN: 1.0000 | Freq: Once | ORAL | 0 refills | Status: AC

## 2022-07-20 NOTE — Progress Notes (Signed)

## 2022-07-26 ENCOUNTER — Encounter: Payer: Self-pay | Admitting: Gastroenterology

## 2022-08-01 ENCOUNTER — Encounter: Payer: Self-pay | Admitting: Gastroenterology

## 2022-08-01 ENCOUNTER — Encounter: Payer: Self-pay | Admitting: Nurse Practitioner

## 2022-08-01 ENCOUNTER — Ambulatory Visit (AMBULATORY_SURGERY_CENTER): Payer: BC Managed Care – PPO | Admitting: Gastroenterology

## 2022-08-01 ENCOUNTER — Encounter: Payer: BC Managed Care – PPO | Admitting: Gastroenterology

## 2022-08-01 ENCOUNTER — Ambulatory Visit: Payer: BC Managed Care – PPO | Admitting: Nurse Practitioner

## 2022-08-01 VITALS — BP 132/70 | HR 69 | Temp 97.5°F | Resp 24 | Ht 72.0 in | Wt 277.0 lb

## 2022-08-01 VITALS — BP 164/72 | HR 79 | Temp 97.7°F | Ht 70.25 in | Wt 278.4 lb

## 2022-08-01 DIAGNOSIS — Z800 Family history of malignant neoplasm of digestive organs: Secondary | ICD-10-CM | POA: Diagnosis not present

## 2022-08-01 DIAGNOSIS — E119 Type 2 diabetes mellitus without complications: Secondary | ICD-10-CM

## 2022-08-01 DIAGNOSIS — Z09 Encounter for follow-up examination after completed treatment for conditions other than malignant neoplasm: Secondary | ICD-10-CM | POA: Diagnosis not present

## 2022-08-01 DIAGNOSIS — D123 Benign neoplasm of transverse colon: Secondary | ICD-10-CM

## 2022-08-01 DIAGNOSIS — Z1211 Encounter for screening for malignant neoplasm of colon: Secondary | ICD-10-CM | POA: Diagnosis not present

## 2022-08-01 DIAGNOSIS — Z86010 Personal history of colonic polyps: Secondary | ICD-10-CM

## 2022-08-01 LAB — POCT GLYCOSYLATED HEMOGLOBIN (HGB A1C): Hemoglobin A1C: 5.8 % — AB (ref 4.0–5.6)

## 2022-08-01 MED ORDER — SODIUM CHLORIDE 0.9 % IV SOLN: 500.0000 mL | Freq: Once | INTRAVENOUS | Status: DC

## 2022-08-01 NOTE — Progress Notes (Signed)
Called to room to assist during endoscopic procedure.  Patient ID and intended procedure confirmed with present staff. Received instructions for my participation in the procedure from the performing physician.  

## 2022-08-01 NOTE — Progress Notes (Signed)
History and Physical:  This patient presents for endoscopic testing for: Encounter Diagnosis  Name Primary?   Personal history of colonic polyps Yes    TA x 2 and HP x  05 May 2017 (when pos cologuard) Also both parents reportedly had CRC Diverticular bleed treated with supportive care January 2023 Patient is otherwise without complaints or active issues today.   Past Medical History: Past Medical History:  Diagnosis Date   Anemia    Anxiety    Blood transfusion without reported diagnosis    Colon abnormality Positive COLOGUARD **POSITIVE** 03/16/2017   History of cellulitis    Hypertension      Past Surgical History: Past Surgical History:  Procedure Laterality Date   COLONOSCOPY      Allergies: Allergies  Allergen Reactions   Amlodipine     Nosebleed    Losartan     Nosebleed    Penicillins Hives    Has patient had a PCN reaction causing immediate rash, facial/tongue/throat swelling, SOB or lightheadedness with hypotension: No Has patient had a PCN reaction causing severe rash involving mucus membranes or skin necrosis: No Has patient had a PCN reaction that required hospitalization: No Has patient had a PCN reaction occurring within the last 10 years: No If all of the above answers are "NO", then may proceed with Cephalosporin use. Pt tolerated Keflex in April 2018    Outpatient Meds: Current Outpatient Medications  Medication Sig Dispense Refill   acetaminophen (TYLENOL) 500 MG tablet Take 1 tablet (500 mg total) by mouth every 6 (six) hours as needed. 30 tablet 0   Multiple Vitamins-Iron (MULTIVITAMIN/IRON PO) Take 1 capsule by mouth daily. (Patient not taking: Reported on 07/20/2022)     Current Facility-Administered Medications  Medication Dose Route Frequency Provider Last Rate Last Admin   0.9 %  sodium chloride infusion  500 mL Intravenous Once Danis, Kirke Corin, MD           ___________________________________________________________________ Objective   Exam:  BP 133/73   Pulse (!) 53   Temp (!) 97.5 F (36.4 C)   Ht 6' (1.829 m)   Wt 277 lb (125.6 kg)   SpO2 98%   BMI 37.57 kg/m   CV: regular , S1/S2 Resp: clear to auscultation bilaterally, normal RR and effort noted GI: soft, no tenderness, with active bowel sounds.   Assessment: Encounter Diagnosis  Name Primary?   Personal history of colonic polyps Yes     Plan: Colonoscopy  The benefits and risks of the planned procedure were described in detail with the patient or (when appropriate) their health care proxy.  Risks were outlined as including, but not limited to, bleeding, infection, perforation, adverse medication reaction leading to cardiac or pulmonary decompensation, pancreatitis (if ERCP).  The limitation of incomplete mucosal visualization was also discussed.  No guarantees or warranties were given.    The patient is appropriate for an endoscopic procedure in the ambulatory setting.   - Wilfrid Lund, MD

## 2022-08-01 NOTE — Progress Notes (Signed)
Pt's states no medical or surgical changes since previsit or office visit. 

## 2022-08-01 NOTE — Progress Notes (Signed)
2411  Pt experienced laryngeal spasm with jaw thrust  performed. Robinul 0.2 mg IV given due large amount of secretions upon assessment.  MD made aware, vss

## 2022-08-01 NOTE — Patient Instructions (Signed)
1. Type 2 diabetes mellitus without complication, without long-term current use of insulin (HCC)  - POCT glycosylated hemoglobin (Hb A1C)  Lab Results  Component Value Date   HGBA1C 5.8 (A) 08/01/2022   Follow up:  Follow up in 6 months or sooner if needed

## 2022-08-01 NOTE — Progress Notes (Signed)
$'@Patient'u$  ID: Todd Osborn, male    DOB: May 26, 1961, 62 y.o.   MRN: [id number]  Chief Complaint  Patient presents with   Follow-up    Had a colonoscopy this am.     Referring provider: Fenton Foy, NP   HPI  Todd Osborn 62 y.o. 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 To the [facility] for hospital follow up and to establish care.      Patient presents for a diabetes follow up today. Overall doing well. No new issues or concerns. Complaint with medications, diet, and exercise. Denies f/c/s, n/v/d, hemoptysis, PND, leg swelling Denies chest pain or edema      Allergies  Allergen Reactions   Amlodipine     Nosebleed    Losartan     Nosebleed    Penicillins Hives    Has patient had a PCN reaction causing immediate rash, facial/tongue/throat swelling, SOB or lightheadedness with hypotension: No Has patient had a PCN reaction causing severe rash involving mucus membranes or skin necrosis: No Has patient had a PCN reaction that required hospitalization: No Has patient had a PCN reaction occurring within the last 10 years: No If all of the above answers are "NO", then may proceed with Cephalosporin use. Pt tolerated Keflex in April 2018    Immunization History  Administered Date(s) Administered   Tdap 10/05/2012    Past Medical History:  Diagnosis Date   Anemia    Anxiety    Blood transfusion without reported diagnosis    Colon abnormality Positive COLOGUARD **POSITIVE** 03/16/2017   History of cellulitis    Hypertension     Tobacco History: Social History   Tobacco Use  Smoking Status Former   Types: Cigarettes   Quit date: 07/04/1986   Years since quitting: 36.1  Smokeless Tobacco Never   Counseling given: Not Answered   Outpatient Encounter Medications as of 08/01/2022  Medication Sig   acetaminophen (TYLENOL) 500 MG tablet Take 1 tablet (500 mg total) by  mouth every 6 (six) hours as needed. (Patient not taking: Reported on 08/01/2022)   Multiple Vitamins-Iron (MULTIVITAMIN/IRON PO) Take 1 capsule by mouth daily. (Patient not taking: Reported on 07/20/2022)   Facility-Administered Encounter Medications as of 08/01/2022  Medication   0.9 %  sodium chloride infusion     Review of Systems  Review of Systems  Constitutional: Negative.   HENT: Negative.    Cardiovascular: Negative.   Gastrointestinal: Negative.   Allergic/Immunologic: Negative.   Neurological: Negative.   Psychiatric/Behavioral: Negative.         Physical Exam  BP (!) 164/72   Pulse 79   Temp 97.7 F (36.5 C) (Temporal)   Ht 5' 10.25" (1.784 m)   Wt 278 lb 6.4 oz (126.3 kg)   SpO2 97%   BMI 39.66 kg/m   Wt Readings from Last 5 Encounters:  08/01/22 278 lb 6.4 oz (126.3 kg)  08/01/22 277 lb (125.6 kg)  07/20/22 260 lb (117.9 kg)  01/28/22 260 lb 8 oz (118.2 kg)  09/03/21 272 lb (123.4 kg)     Physical Exam Vitals and nursing note reviewed.  Constitutional:      General: He is not in acute distress.    Appearance: He is well-developed.  Cardiovascular:     Rate and Rhythm: Normal rate and regular rhythm.  Pulmonary:     Effort: Pulmonary effort is normal.     Breath sounds:  Normal breath sounds.  Skin:    General: Skin is warm and dry.  Neurological:     Mental Status: He is alert and oriented to person, place, and time.      Lab Results:  CBC    Component Value Date/Time   WBC 9.3 01/28/2022 1446   WBC 7.6 08/30/2021 1222   RBC 5.08 01/28/2022 1446   RBC 4.32 08/30/2021 1222   HGB 14.2 01/28/2022 1446   HCT 43.6 01/28/2022 1446   PLT 280 01/28/2022 1446   MCV 86 01/28/2022 1446   MCH 28.0 01/28/2022 1446   MCH 30.1 07/27/2021 0259   MCHC 32.6 01/28/2022 1446   MCHC 33.3 08/30/2021 1222   RDW 13.9 01/28/2022 1446   LYMPHSABS 1.5 07/30/2021 1002   MONOABS 1.7 (H) 07/26/2021 0741   EOSABS 0.5 (H) 07/30/2021 1002   BASOSABS 0.1  07/30/2021 1002    BMET    Component Value Date/Time   NA 143 01/28/2022 1446   K 4.6 01/28/2022 1446   CL 104 01/28/2022 1446   CO2 22 01/28/2022 1446   GLUCOSE 92 01/28/2022 1446   GLUCOSE 111 (H) 07/27/2021 0259   BUN 16 01/28/2022 1446   CREATININE 0.87 01/28/2022 1446   CREATININE 0.86 02/20/2017 1524   CALCIUM 9.2 01/28/2022 1446   GFRNONAA >60 07/27/2021 0259   GFRNONAA >89 02/20/2017 1524   GFRAA >89 02/20/2017 1524      Assessment & Plan:   Type 2 diabetes mellitus without complication, without long-term current use of insulin (HCC) - POCT glycosylated hemoglobin (Hb A1C)  Lab Results  Component Value Date   HGBA1C 5.8 (A) 08/01/2022   Follow up:  Follow up in 6 months or sooner if needed     Fenton Foy, NP 08/01/2022

## 2022-08-01 NOTE — Patient Instructions (Signed)
YOU HAD AN ENDOSCOPIC PROCEDURE TODAY AT [facility]:   Refer to the procedure report that was given to you for any specific questions about what was found during the examination.  If the procedure report does not answer your questions, please call your gastroenterologist to clarify.  If you requested that your care partner not be given the details of your procedure findings, then the procedure report has been included in a sealed envelope for you to review at your convenience later.  YOU SHOULD EXPECT: Some feelings of bloating in the abdomen. Passage of more gas than usual.  Walking can help get rid of the air that was put into your GI tract during the procedure and reduce the bloating. If you had a lower endoscopy (such as a colonoscopy or flexible sigmoidoscopy) you may notice spotting of blood in your stool or on the toilet paper. If you underwent a bowel prep for your procedure, you may not have a normal bowel movement for a few days.  Please Note:  You might notice some irritation and congestion in your nose or some drainage.  This is from the oxygen used during your procedure.  There is no need for concern and it should clear up in a day or so.  SYMPTOMS TO REPORT IMMEDIATELY:  Following lower endoscopy (colonoscopy or flexible sigmoidoscopy):  Excessive amounts of blood in the stool  Significant tenderness or worsening of abdominal pains  Swelling of the abdomen that is new, acute  Fever of 100F or higher   For urgent or emergent issues, a gastroenterologist can be reached at any hour by calling [phone]. Do not use MyChart messaging for urgent concerns.    DIET:  We do recommend a small meal at first, but then you may proceed to your regular diet.  Drink plenty of fluids but you should avoid alcoholic beverages for 24 hours.  MEDICATIONS: Continue present medications. No Aspirin, Ibuprofen, Naproxen, or other non-steroidal anti-inflammatory drugs for 7 days  after polyp removal.  Please see handouts given to you by your recovery nurse : Polyps, diverticulosis, hemorrhoids, and including the "Clip Cards" given to you to keep in your wallet.  FOLLOW UP: Repeat colonoscopy in 3 years for surveillance with GOLYTELY PREP FOR NEXT EXAM.  Thank you for allowing [place] to provide for your healthcare needs today.    ACTIVITY:  You should plan to take it easy for the rest of today and you should NOT DRIVE or use heavy machinery until tomorrow (because of the sedation medicines used during the test).    FOLLOW UP: Our staff will call the number listed on your records the next business day following your procedure.  We will call around 7:15- 8:00 am to check on you and address any questions or concerns that you may have regarding the information given to you following your procedure. If we do not reach you, we will leave a message.     If any biopsies were taken you will be contacted by phone or by letter within the next 1-3 weeks.  Please call us at [phone] if you have not heard about the biopsies in 3 weeks.    SIGNATURES/CONFIDENTIALITY: You and/or your care partner have signed paperwork which will be entered into your electronic medical record.  These signatures attest to the fact that that the information above on your After Visit Summary has been reviewed and is understood.  Full responsibility of the confidentiality of this discharge information lies  with you and/or your care-partner.

## 2022-08-01 NOTE — Op Note (Signed)
[facility] Patient Name: Todd Osborn Procedure Date: 08/01/2022 9:22 AM MRN: [id number] Endoscopist: Mallie Mussel L. Loletha Carrow , MD, [id number] Age: 62 Referring MD:  Date of Birth: 1960/08/27 Gender: Male Account #: [id number] Procedure:                Colonoscopy Indications:              Colon cancer screening in patient at increased                            risk: Colorectal cancer in mother and father                           Surveillance: Personal history of adenomatous                            polyps on last colonoscopy > 5 years ago                            (diminutive tubular adenoma times 05 May 2017,                            that exam done for positive Cologuard) Procedure:                Pre-Anesthesia Assessment:                           - Prior to the procedure, a History and Physical                            was performed, and patient medications and                            allergies were reviewed. The patient's tolerance of                            previous anesthesia was also reviewed. The risks                            and benefits of the procedure and the sedation                            options and risks were discussed with the patient.                            All questions were answered, and informed consent                            was obtained. Prior Anticoagulants: The patient has                            taken no anticoagulant or antiplatelet agents. ASA                            Grade Assessment: II - A patient with  mild systemic                            disease. After reviewing the risks and benefits,                            the patient was deemed in satisfactory condition to                            undergo the procedure.                           After obtaining informed consent, the colonoscope                            was passed under direct vision. Throughout the                            procedure, the  patient's blood pressure, pulse, and                            oxygen saturations were monitored continuously. The                            Olympus CF-HQ190L [id number]) Colonoscope was                            introduced through the anus and advanced to the the                            cecum, identified by appendiceal orifice and                            ileocecal valve. The colonoscopy was somewhat                            difficult due to Redundant colon, body habitus,                            respiratory motion. The patient tolerated the                            procedure. The quality of the bowel preparation was                            fair in the left colon, somewhat better in the                            right colon but still with retained fibrous food                            debris, liquid and diverticular stool balls. The  ileocecal valve, appendiceal orifice, and rectum                            were photographed. The bowel preparation used was                            SUPREP via split dose instruction. Scope In: 9:33:08 AM Scope Out: 9:52:25 AM Scope Withdrawal Time: 0 hours 16 minutes 13 seconds  Total Procedure Duration: 0 hours 19 minutes 17 seconds  Findings:                 The perianal and digital rectal examinations were                            normal.                           Repeat examination of right colon under NBI                            performed.                           There was a large lipoma, at the hepatic flexure.                           A 3 mm polyp was found in the proximal transverse                            colon. The polyp was sessile. The polyp was removed                            with a cold snare. Resection and retrieval were                            complete. There was post polypectomy bleeding. For                            hemostasis, two hemostatic clips were successfully                             placed (MR conditional). There was no bleeding at                            the end of the procedure.                           Multiple diverticula were found in the left colon.                           Internal hemorrhoids were found.                           The exam was otherwise without abnormality on  direct and retroflexion views. Complications:            No immediate complications. Estimated Blood Loss:     Estimated blood loss was minimal. Impression:               - Preparation of the colon was fair.                           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                           - One 3 mm polyp in the proximal transverse colon,                            removed with a cold snare. Resected and retrieved.                            Clips (MR conditional) were placed.                           - Diverticulosis in the left colon.                           - Internal hemorrhoids.                           - The examination was otherwise normal on direct                            and retroflexion views. Recommendation:           - Patient has a contact number available for                            emergencies. The signs and symptoms of potential                            delayed complications were discussed with the                            patient. Return to normal activities tomorrow.                            Written discharge instructions were provided to the                            patient.                           - Resume previous diet.                           - Continue present medications.                           - No aspirin, ibuprofen, naproxen, or other  non-steroidal anti-inflammatory drugs for 7 days                            after polyp removal.                           - Await pathology results.                           - Repeat colonoscopy in 3 years for  surveillance.                            (See prep quality noted above) - GOLYTELY PREP FOR                            NEXT EXAM Keara Pagliarulo L. Loletha Carrow, MD 08/01/2022 10:03:58 AM This report has been signed electronically.

## 2022-08-01 NOTE — Assessment & Plan Note (Signed)
-  POCT glycosylated hemoglobin (Hb A1C)  Lab Results  Component Value Date   HGBA1C 5.8 (A) 08/01/2022   Follow up:  Follow up in 6 months or sooner if needed

## 2022-08-01 NOTE — Progress Notes (Signed)
Report given to PACU, vss 

## 2022-08-02 ENCOUNTER — Telehealth: Payer: Self-pay

## 2022-08-02 NOTE — Telephone Encounter (Signed)
  Follow up Call-     08/01/2022    8:51 AM  Call back number  Post procedure Call Back phone  # [phone]  Permission to leave phone message Yes     Patient questions:  Do you have a fever, pain , or abdominal swelling? No. Pain Score  0 *  Have you tolerated food without any problems? yes  Have you been able to return to your normal activities? Yes.    Do you have any questions about your discharge instructions: Diet   No. Medications  No. Follow up visit  No.  Do you have questions or concerns about your Care? No.  Actions: * If pain score is 4 or above: No action needed, pain <4.

## 2022-08-05 ENCOUNTER — Encounter: Payer: Self-pay | Admitting: Gastroenterology

## 2022-08-10 DIAGNOSIS — Z20822 Contact with and (suspected) exposure to covid-19: Secondary | ICD-10-CM | POA: Diagnosis not present

## 2022-08-10 DIAGNOSIS — J069 Acute upper respiratory infection, unspecified: Secondary | ICD-10-CM | POA: Diagnosis not present

## 2022-08-10 DIAGNOSIS — R509 Fever, unspecified: Secondary | ICD-10-CM | POA: Diagnosis not present

## 2022-09-09 IMAGING — CT CT CTA ABD/PEL W/CM AND/OR W/O CM
3 of 12 series · 13 of 46 positions shown, 18 images · IV contrast (agent unspecified)
Comparison: None.

CLINICAL DATA: Hypertension, rectal bleeding

EXAM:
CTA ABDOMEN AND PELVIS WITHOUT AND WITH CONTRAST
TECHNIQUE: Multidetector CT imaging of the abdomen and pelvis was performed
using the standard protocol during bolus administration of
intravenous contrast. Multiplanar reconstructed images and MIPs were
obtained and reviewed to evaluate the vascular anatomy.

[Series 6: axial arterial · axial · arterial · 0.86mm/px · z∈[-491,-83]mm · 8 of 246 slices shown]
[im 21/246  soft-tissue]
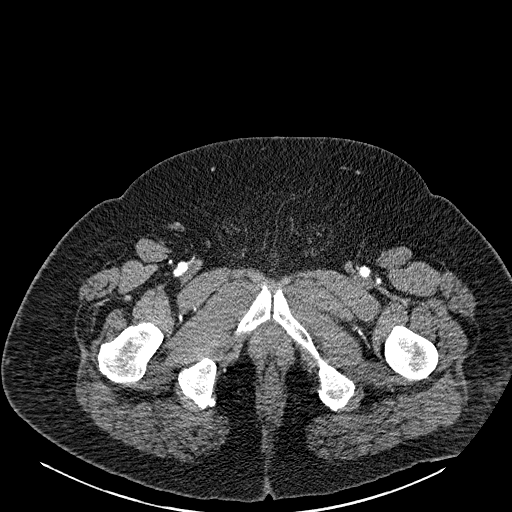
[im 62/246  soft-tissue]
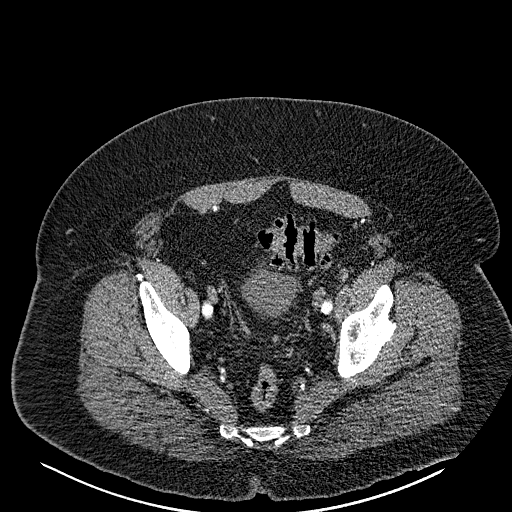
[im 82/246  soft-tissue]
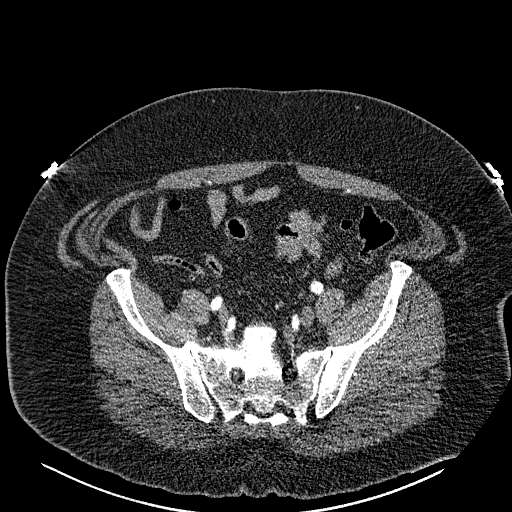
[im 103/246  soft-tissue]
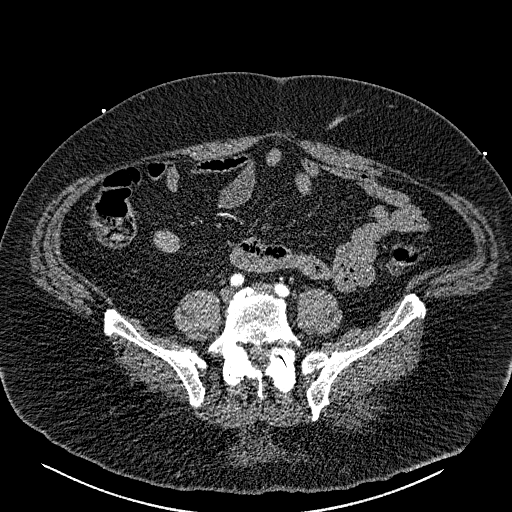
[im 143/246  soft-tissue]
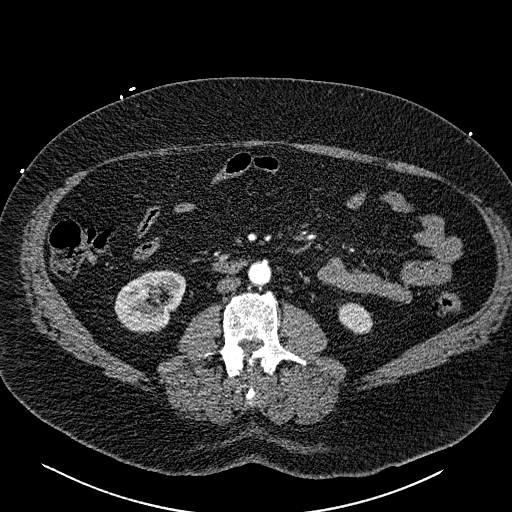
[im 164/246  soft-tissue]
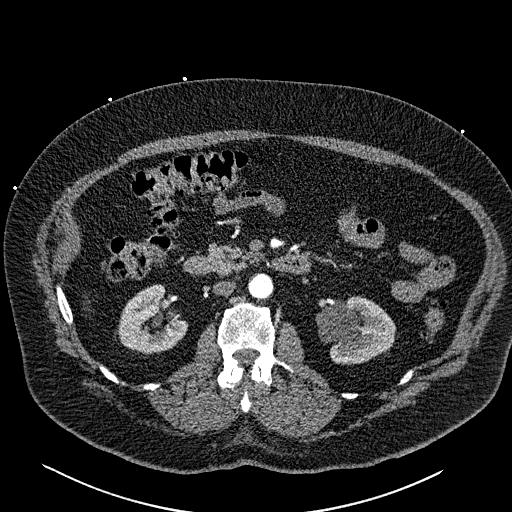
[im 184/246  soft-tissue]
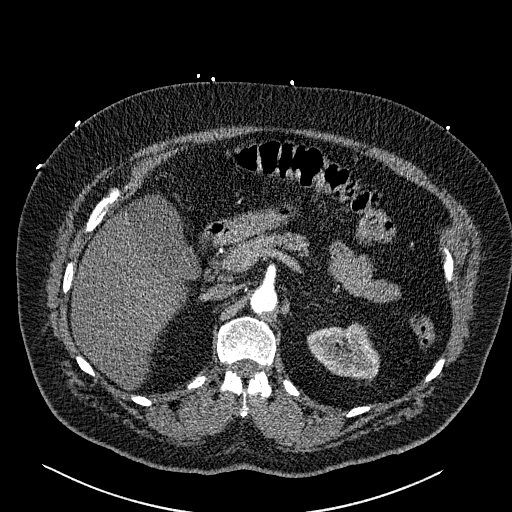
[im 225/246  soft-tissue]
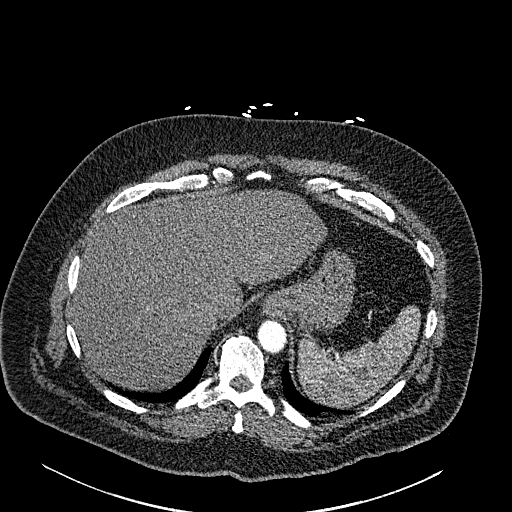

[Series 9: coronal arterial · coronal · arterial · 1.08mm/px · 2 of 144 slices shown, 3 images]
[im 48/144  soft-tissue]
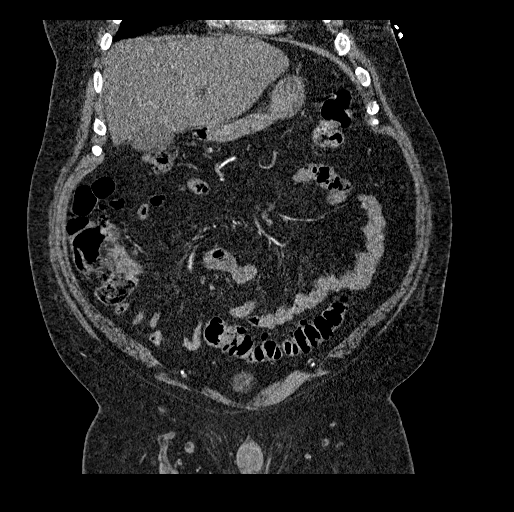
[im 48/144  bone]
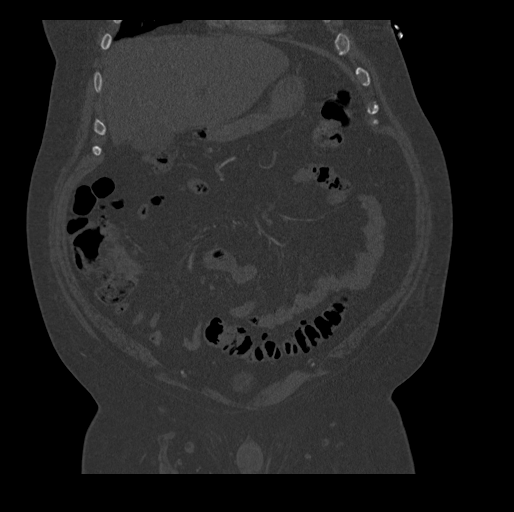
[im 96/144  soft-tissue]
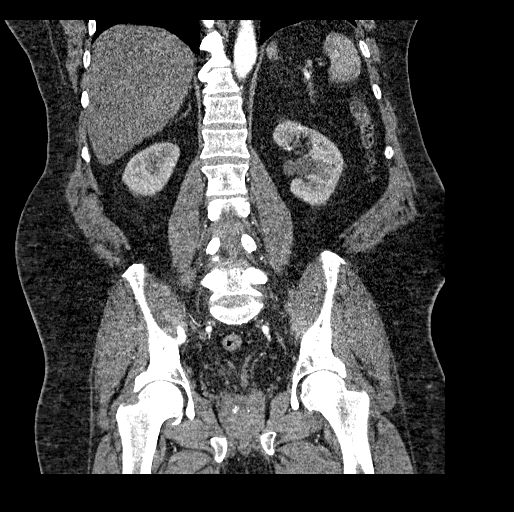

[Series 13: axial portal venous · axial · portal-venous · 0.94mm/px · z∈[-407,-157]mm · 3 of 100 slices shown, 7 images]
[im 25/100  soft-tissue]
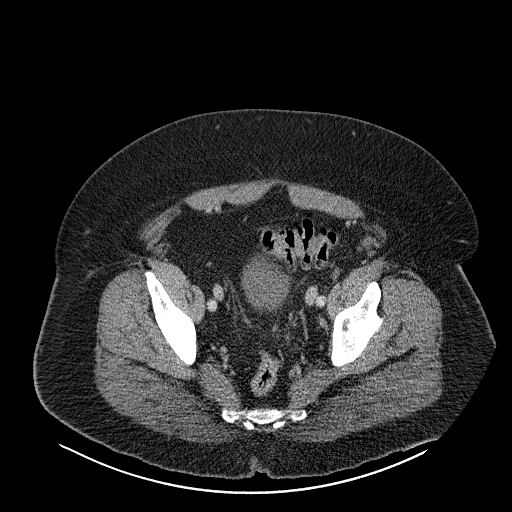
[im 25/100  lung]
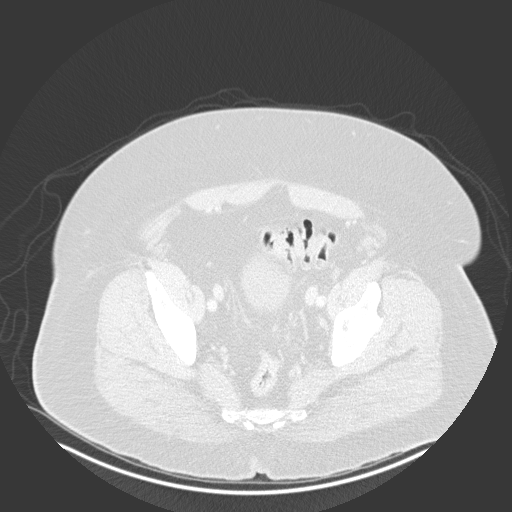
[im 25/100  bone]
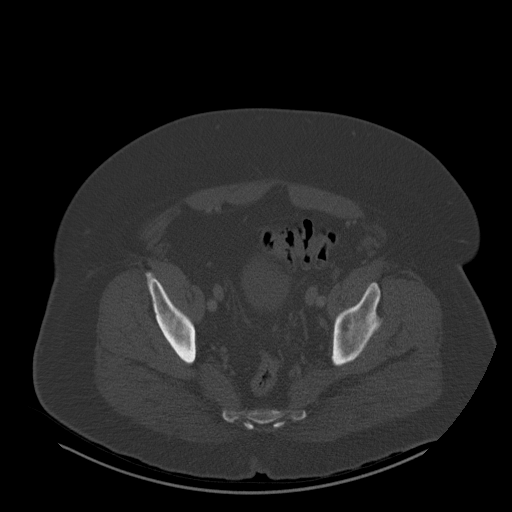
[im 50/100  soft-tissue]
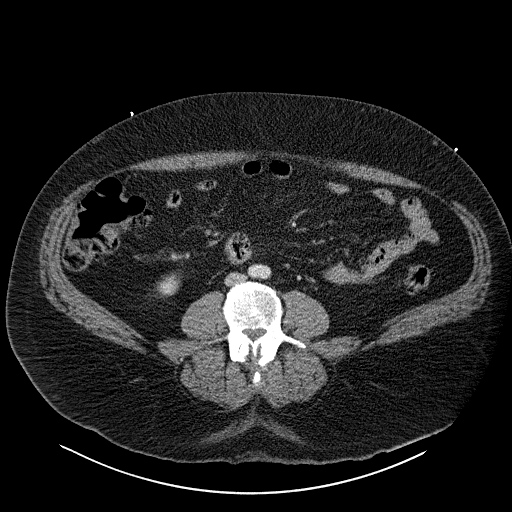
[im 50/100  lung]
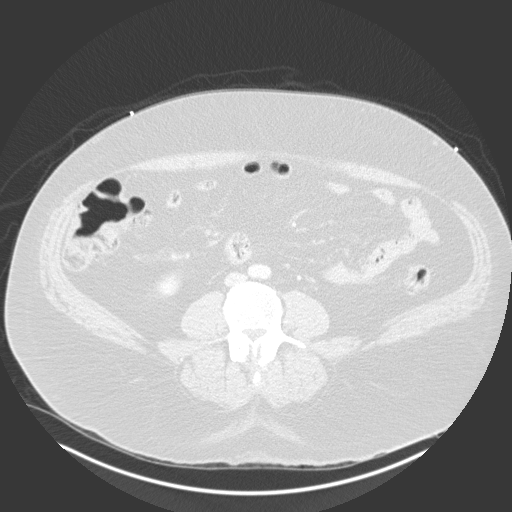
[im 75/100  soft-tissue]
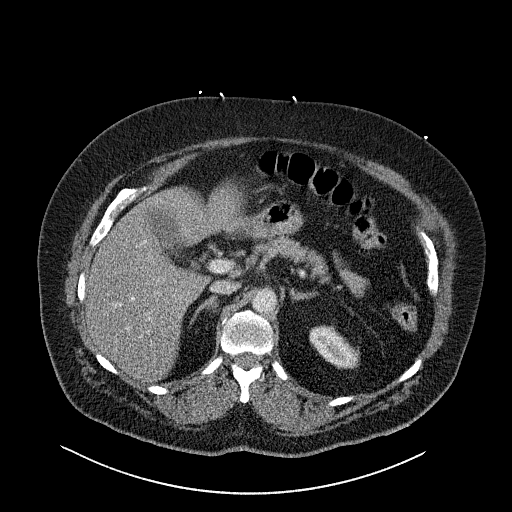
[im 75/100  lung]
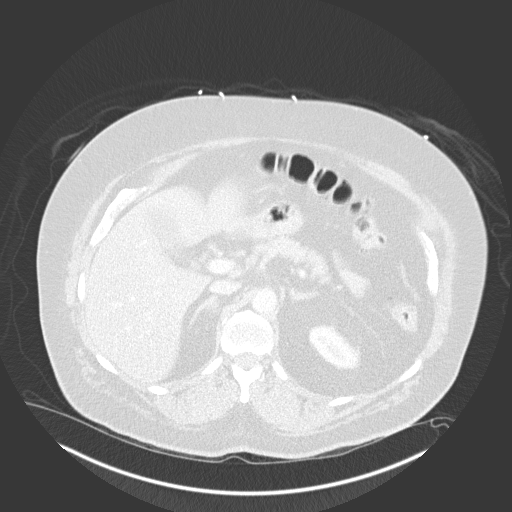

[13 of 46 positions shown; findings below may reference images not displayed]

RADIATION DOSE REDUCTION: This exam was performed according to the
departmental dose-optimization program which includes automated
exposure control, adjustment of the mA and/or kV according to
patient size and/or use of iterative reconstruction technique.

CONTRAST:  100mL OMNIPAQUE IOHEXOL 350 MG/ML SOLN
FINDINGS: VASCULAR

Aorta: Minimal calcified atheromatous plaque. No aneurysm,
dissection, or stenosis.

Celiac: Patent without evidence of aneurysm, dissection, vasculitis
or significant stenosis.

SMA: Patent without evidence of aneurysm, dissection, vasculitis or
significant stenosis.

Renals: Single left, widely patent. Duplicated right, superior
dominant, both patent.

IMA: Patent without evidence of aneurysm, dissection, vasculitis or
significant stenosis.

Inflow: Minimal calcified plaque. No aneurysm, dissection, or
stenosis.

Proximal Outflow: Minimally atheromatous, patent

Veins: Portal vein patent.  No venous pathology evident.

Review of the MIP images confirms the above findings.

NON-VASCULAR

Lower chest: No pleural or pericardial effusion. Visualized lung
bases clear.

Hepatobiliary: No focal liver abnormality is seen. No gallstones,
gallbladder wall thickening, or biliary dilatation.

Pancreas: Unremarkable. No pancreatic ductal dilatation or
surrounding inflammatory changes.

Spleen: Normal in size.  1.6 cm mid splenic cyst.

Adrenals/Urinary Tract: 2.5 cm right adrenal adenoma. No
urolithiasis or hydronephrosis. Probable parapelvic cysts
bilaterally. Urinary bladder is incompletely distended.

Stomach/Bowel: Stomach is nondistended. Small bowel decompressed.
Normal appendix. The colon is nondilated. Multiple descending and
sigmoid diverticula without adjacent inflammatory change. No active
extravasation.

Lymphatic: No abdominal or pelvic adenopathy.

Reproductive: Mild prostate enlargement with central coarse
calcifications.

Other: No ascites.  No free air.

Musculoskeletal: Mild bilateral hip DJD. Spondylitic changes in the
lower lumbar spine.
IMPRESSION: 1. No evidence of active GI bleed.
2. Descending and sigmoid diverticulosis
3.  Aortic Atherosclerosis (YSVO1-170.0).

## 2022-09-12 IMAGING — DX DG CHEST 1V PORT
1 series · 1 of 1 positions shown · non-contrast
Comparison: Chest x-ray 07/23/2021

CLINICAL DATA: Leukocytosis

EXAM:
PORTABLE CHEST 1 VIEW

[chest ap]
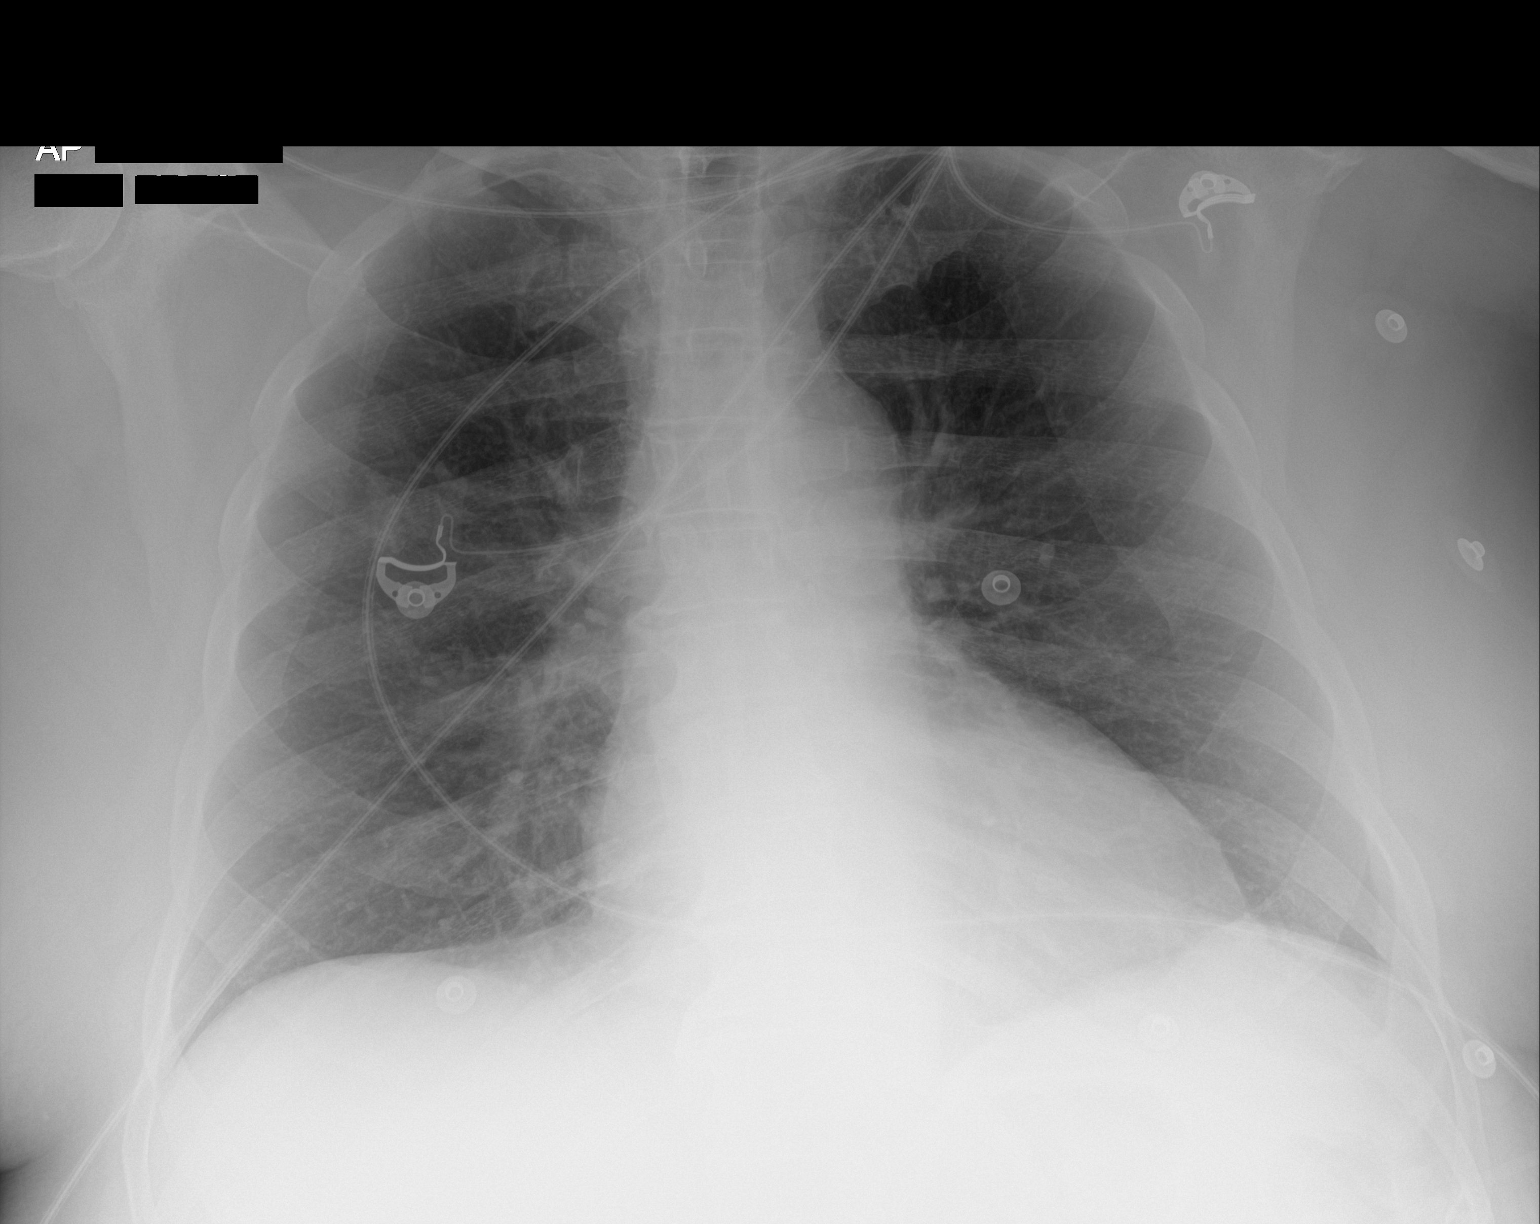

[1 of 1 positions shown; findings below may reference images not displayed]

FINDINGS: Heart size and mediastinal contours are within normal limits. No
suspicious pulmonary opacities identified.

No pleural effusion or pneumothorax visualized.

No acute osseous abnormality appreciated.
IMPRESSION: No acute intrathoracic process identified.

## 2023-01-30 ENCOUNTER — Encounter: Payer: Self-pay | Admitting: Nurse Practitioner

## 2023-01-30 ENCOUNTER — Ambulatory Visit (INDEPENDENT_AMBULATORY_CARE_PROVIDER_SITE_OTHER): Payer: Self-pay | Admitting: Nurse Practitioner

## 2023-01-30 VITALS — BP 140/82 | HR 67 | Temp 97.6°F | Ht 71.0 in | Wt 282.0 lb

## 2023-01-30 DIAGNOSIS — E119 Type 2 diabetes mellitus without complications: Secondary | ICD-10-CM

## 2023-01-30 DIAGNOSIS — Z13220 Encounter for screening for lipoid disorders: Secondary | ICD-10-CM

## 2023-01-30 LAB — POCT GLYCOSYLATED HEMOGLOBIN (HGB A1C): Hemoglobin A1C: 5.9 % — AB (ref 4.0–5.6)

## 2023-01-30 NOTE — Progress Notes (Signed)
@Patient  ID: Todd Osborn, male    DOB: 1960-12-25, 62 y.o.   MRN: [id number]  Chief Complaint  Patient presents with   Follow-up    No concerns    Referring provider: Ivonne Andrew, NP   HPI  Todd Osborn 62 y.o. 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 To the [facility] for hospital follow up and to establish care.      Patient presents for a diabetes follow up today. Overall doing well. Trying to do better with diet. No new issues or concerns. A1C today is 5.9. Complaint with medications, diet, and exercise. Denies f/c/s, n/v/d, hemoptysis, PND, leg swelling. Denies chest pain or edema.      Allergies  Allergen Reactions   Amlodipine     Nosebleed    Losartan     Nosebleed    Penicillins Hives    Has patient had a PCN reaction causing immediate rash, facial/tongue/throat swelling, SOB or lightheadedness with hypotension: No Has patient had a PCN reaction causing severe rash involving mucus membranes or skin necrosis: No Has patient had a PCN reaction that required hospitalization: No Has patient had a PCN reaction occurring within the last 10 years: No If all of the above answers are "NO", then may proceed with Cephalosporin use. Pt tolerated Keflex in April 2018    Immunization History  Administered Date(s) Administered   Tdap 10/05/2012    Past Medical History:  Diagnosis Date   Anemia    Anxiety    Blood transfusion without reported diagnosis    Colon abnormality Positive COLOGUARD **POSITIVE** 03/16/2017   History of cellulitis    Hypertension     Tobacco History: Social History   Tobacco Use  Smoking Status Former   Current packs/day: 0.00   Types: Cigarettes   Quit date: 07/04/1986   Years since quitting: 36.6  Smokeless Tobacco Never   Counseling given: Not Answered   Outpatient Encounter Medications as of 01/30/2023  Medication Sig    acetaminophen (TYLENOL) 500 MG tablet Take 1 tablet (500 mg total) by mouth every 6 (six) hours as needed. (Patient not taking: Reported on 08/01/2022)   Multiple Vitamins-Iron (MULTIVITAMIN/IRON PO) Take 1 capsule by mouth daily. (Patient not taking: Reported on 07/20/2022)   Facility-Administered Encounter Medications as of 01/30/2023  Medication   0.9 %  sodium chloride infusion     Review of Systems  Review of Systems  Constitutional: Negative.   HENT: Negative.    Cardiovascular: Negative.   Gastrointestinal: Negative.   Allergic/Immunologic: Negative.   Neurological: Negative.   Psychiatric/Behavioral: Negative.         Physical Exam  BP (!) 140/82 (BP Location: Left Arm)   Pulse 67   Temp 97.6 F (36.4 C)   Ht 5\' 11"  (1.803 m)   Wt 282 lb (127.9 kg)   SpO2 96%   BMI 39.33 kg/m   Wt Readings from Last 5 Encounters:  01/30/23 282 lb (127.9 kg)  08/01/22 278 lb 6.4 oz (126.3 kg)  08/01/22 277 lb (125.6 kg)  07/20/22 260 lb (117.9 kg)  01/28/22 260 lb 8 oz (118.2 kg)     Physical Exam Vitals and nursing note reviewed.  Constitutional:      General: He is not in acute distress.    Appearance: He is well-developed.  Cardiovascular:     Rate and Rhythm: Normal rate and regular rhythm.  Pulmonary:  Effort: Pulmonary effort is normal.     Breath sounds: Normal breath sounds.  Skin:    General: Skin is warm and dry.  Neurological:     Mental Status: He is alert and oriented to person, place, and time.  Psychiatric:        Mood and Affect: Mood normal.        Behavior: Behavior normal.      Lab Results:  CBC    Component Value Date/Time   WBC 9.3 01/28/2022 1446   WBC 7.6 08/30/2021 1222   RBC 5.08 01/28/2022 1446   RBC 4.32 08/30/2021 1222   HGB 14.2 01/28/2022 1446   HCT 43.6 01/28/2022 1446   PLT 280 01/28/2022 1446   MCV 86 01/28/2022 1446   MCH 28.0 01/28/2022 1446   MCH 30.1 07/27/2021 0259   MCHC 32.6 01/28/2022 1446   MCHC 33.3  08/30/2021 1222   RDW 13.9 01/28/2022 1446   LYMPHSABS 1.5 07/30/2021 1002   MONOABS 1.7 (H) 07/26/2021 0741   EOSABS 0.5 (H) 07/30/2021 1002   BASOSABS 0.1 07/30/2021 1002    BMET    Component Value Date/Time   NA 143 01/28/2022 1446   K 4.6 01/28/2022 1446   CL 104 01/28/2022 1446   CO2 22 01/28/2022 1446   GLUCOSE 92 01/28/2022 1446   GLUCOSE 111 (H) 07/27/2021 0259   BUN 16 01/28/2022 1446   CREATININE 0.87 01/28/2022 1446   CREATININE 0.86 02/20/2017 1524   CALCIUM 9.2 01/28/2022 1446   GFRNONAA >60 07/27/2021 0259   GFRNONAA >89 02/20/2017 1524   GFRAA >89 02/20/2017 1524      Assessment & Plan:   Type 2 diabetes mellitus without complication, without long-term current use of insulin (HCC) - Microalbumin/Creatinine Ratio, Urine - POCT glycosylated hemoglobin (Hb A1C) - CBC - Comprehensive metabolic panel   2. Lipid screening  - Lipid Panel   Follow up:  Follow up in 6 months     Ivonne Andrew, NP 01/30/2023

## 2023-01-30 NOTE — Patient Instructions (Addendum)
1. Type 2 diabetes mellitus without complication, without long-term current use of insulin (HCC)  - Microalbumin/Creatinine Ratio, Urine - POCT glycosylated hemoglobin (Hb A1C) - CBC - Comprehensive metabolic panel   2. Lipid screening  - Lipid Panel   Follow up:  Follow up in 6 months

## 2023-01-30 NOTE — Assessment & Plan Note (Signed)
-   Microalbumin/Creatinine Ratio, Urine - POCT glycosylated hemoglobin (Hb A1C) - CBC - Comprehensive metabolic panel   2. Lipid screening  - Lipid Panel   Follow up:  Follow up in 6 months

## 2023-05-16 ENCOUNTER — Other Ambulatory Visit: Payer: Self-pay

## 2023-05-16 ENCOUNTER — Emergency Department (HOSPITAL_COMMUNITY)
Admission: EM | Admit: 2023-05-16 | Discharge: 2023-05-16 | Disposition: A | Discharge status: Home / Self Care | Payer: BC Managed Care – PPO | Attending: Emergency Medicine | Admitting: Emergency Medicine

## 2023-05-16 ENCOUNTER — Encounter (HOSPITAL_COMMUNITY): Payer: Self-pay

## 2023-05-16 DIAGNOSIS — M545 Low back pain, unspecified: Secondary | ICD-10-CM | POA: Insufficient documentation

## 2023-05-16 DIAGNOSIS — W010XXA Fall on same level from slipping, tripping and stumbling without subsequent striking against object, initial encounter: Secondary | ICD-10-CM | POA: Insufficient documentation

## 2023-05-16 DIAGNOSIS — R519 Headache, unspecified: Secondary | ICD-10-CM | POA: Diagnosis not present

## 2023-05-16 DIAGNOSIS — R55 Syncope and collapse: Secondary | ICD-10-CM | POA: Insufficient documentation

## 2023-05-16 LAB — CBC WITH DIFFERENTIAL/PLATELET
Abs Immature Granulocytes: 0.02 10*3/uL (ref 0.00–0.07)
Basophils Absolute: 0.1 10*3/uL (ref 0.0–0.1)
Basophils Relative: 1 %
Eosinophils Absolute: 0.3 10*3/uL (ref 0.0–0.5)
Eosinophils Relative: 3 %
HCT: 44.3 % (ref 39.0–52.0)
Hemoglobin: 14.6 g/dL (ref 13.0–17.0)
Immature Granulocytes: 0 %
Lymphocytes Relative: 17 %
Lymphs Abs: 1.7 10*3/uL (ref 0.7–4.0)
MCH: 29.6 pg (ref 26.0–34.0)
MCHC: 33 g/dL (ref 30.0–36.0)
MCV: 89.7 fL (ref 80.0–100.0)
Monocytes Absolute: 0.8 10*3/uL (ref 0.1–1.0)
Monocytes Relative: 8 %
Neutro Abs: 6.9 10*3/uL (ref 1.7–7.7)
Neutrophils Relative %: 71 %
Platelets: 287 10*3/uL (ref 150–400)
RBC: 4.94 MIL/uL (ref 4.22–5.81)
RDW: 12.5 % (ref 11.5–15.5)
WBC: 9.7 10*3/uL (ref 4.0–10.5)
nRBC: 0 % (ref 0.0–0.2)

## 2023-05-16 LAB — BASIC METABOLIC PANEL
Anion gap: 10 (ref 5–15)
BUN: 17 mg/dL (ref 8–23)
CO2: 22 mmol/L (ref 22–32)
Calcium: 9 mg/dL (ref 8.9–10.3)
Chloride: 106 mmol/L (ref 98–111)
Creatinine, Ser: 0.75 mg/dL (ref 0.61–1.24)
GFR, Estimated: >60 mL/min (ref >60)
Glucose, Bld: 121 mg/dL — ABNORMAL HIGH (ref 70–99)
Potassium: 3.9 mmol/L (ref 3.5–5.1)
Sodium: 138 mmol/L (ref 135–145)

## 2023-05-16 LAB — TROPONIN I (HIGH SENSITIVITY): Troponin I (High Sensitivity): 6 ng/L (ref <18)

## 2023-05-16 LAB — CBG MONITORING, ED: Glucose-Capillary: 130 mg/dL — ABNORMAL HIGH (ref 70–99)

## 2023-05-16 NOTE — Discharge Instructions (Signed)
Eat and drink as well as you can for the next couple days.  Follow up with your doctor in the office.  Return for chest pain difficulty breathing if you cough up blood or if you pass out again.  Also return for sudden worsening headache or neck pain.

## 2023-05-16 NOTE — ED Triage Notes (Addendum)
C/o dizziness when walking and then loc. 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  Pt reports hitting head and pain to left side of head and ear.  Denies cp/sob.  Denies blood thinner usage.  Ambulatory to triage

## 2023-05-16 NOTE — ED Provider Triage Note (Signed)
Emergency Medicine Provider Triage Evaluation Note  Todd Osborn , a 62 y.o. male  was evaluated in triage.  Pt complains of syncope.  Review of Systems  Positive: Brief LOC Negative: Chest pain, SOB, illness, vomiting, headache  Physical Exam  BP (!) 160/93 (BP Location: Right Arm)   Pulse 76   Temp 98.1 F (36.7 C) (Oral)   Resp 18   Ht 5\' 11"  (1.803 m)   Wt 120.2 kg   SpO2 95%   BMI 36.96 kg/m  Gen:   Awake, no distress   Resp:  Normal effort  MSK:   Moves extremities without difficulty  Other:    Medical Decision Making  Medically screening exam initiated at 12:36 PM.  Appropriate orders placed.  Todd Osborn was informed that the remainder of the evaluation will be completed by another provider, this initial triage assessment does not replace that evaluation, and the importance of remaining in the ED until their evaluation is complete.  10:00 am at work, stood from crouching and became lightheaded. Leaned against his truck and passed out, woke on the ground. Knows it was a minute or two. No symptoms when he stood. No history of same. On no meds, nonsmoker. Drove himself from [employer].   Elpidio Anis, PA-C 05/16/23 1238

## 2023-05-16 NOTE — ED Provider Notes (Signed)
[place] EMERGENCY DEPARTMENT AT [facility] Provider Note   CSN: [id number] Arrival date & time: 05/16/23  1103     History  Chief Complaint  Patient presents with   Marletta Lor    Todd Osborn is a 62 y.o. male.  62 yo M with a chief complaints of syncopal event.  Patient said he was working on a Product [employer] he stood up and felt lightheaded and when he tried to walk to his car he got worse and collapsed.  Woke up on the ground after striking his head on the ground.  Complaining of a left-sided headache.  He otherwise feels well.  Denies chest pain denies difficulty breathing denies abdominal pain.  Feels like he has been eating and drinking normally.  Denied headache or neck pain prior to the fall.  Has a mild left-sided headache and has some low back pain after the fall.  He denies confusion denies vomiting.   Fall       Home Medications Prior to Admission medications   Medication Sig Start Date End Date Taking? Authorizing Provider  acetaminophen (TYLENOL) 500 MG tablet Take 1 tablet (500 mg total) by mouth every 6 (six) hours as needed. Patient not taking: Reported on 08/01/2022 07/27/21   Rodolph Bong, MD  Multiple Vitamins-Iron (MULTIVITAMIN/IRON PO) Take 1 capsule by mouth daily. Patient not taking: Reported on 07/20/2022    [provider]      Allergies    Amlodipine, Losartan, and Penicillins    Review of Systems   Review of Systems  Physical Exam Updated Vital Signs BP 122/80 (BP Location: Right Arm)   Pulse (!) 54   Temp (!) 97.5 F (36.4 C)   Resp 16   Ht 5\' 11"  (1.803 m)   Wt 120.2 kg   SpO2 97%   BMI 36.96 kg/m  Physical Exam Vitals and nursing note reviewed.  Constitutional:      Appearance: He is well-developed.  HENT:     Head: Normocephalic and atraumatic.  Eyes:     Pupils: Pupils are equal, round, and reactive to light.  Neck:     Vascular: No JVD.  Cardiovascular:     Rate and Rhythm: Normal rate and regular  rhythm.     Heart sounds: No murmur heard.    No friction rub. No gallop.  Pulmonary:     Effort: No respiratory distress.     Breath sounds: No wheezing.  Abdominal:     General: There is no distension.     Tenderness: There is no abdominal tenderness. There is no guarding or rebound.  Musculoskeletal:        General: Normal range of motion.     Cervical back: Normal range of motion and neck supple.  Skin:    Coloration: Skin is not pale.     Findings: No rash.  Neurological:     Mental Status: He is alert and oriented to person, place, and time.  Psychiatric:        Behavior: Behavior normal.     ED Results / Procedures / Treatments   Labs (all labs ordered are listed, but only abnormal results are displayed) Labs Reviewed  BASIC METABOLIC PANEL - Abnormal; Notable for the following components:      Result Value   Glucose, Bld 121 (*)    All other components within normal limits  CBG MONITORING, ED - Abnormal; Notable for the following components:   Glucose-Capillary 130 (*)  All other components within normal limits  CBC WITH DIFFERENTIAL/PLATELET  TROPONIN I (HIGH SENSITIVITY)  TROPONIN I (HIGH SENSITIVITY)    EKG EKG Interpretation Date/Time:  Tuesday May 16 2023 11:26:17 EST Ventricular Rate:  80 PR Interval:  178 QRS Duration:  158 QT Interval:  421 QTC Calculation: 486 R Axis:   -42  Text Interpretation: Sinus rhythm Consider left atrial enlargement Left bundle branch block No significant change since last tracing Confirmed by Melene Plan [phone]) on 05/16/2023 4:54:10 PM  Radiology No results found.  Procedures Procedures    Medications Ordered in ED Medications - No data to display  ED Course/ Medical Decision Making/ A&P                                 Medical Decision Making Amount and/or Complexity of Data Reviewed Labs: ordered. ECG/medicine tests: ordered.   62 yo M with a chief complaints of a syncopal event.  Sounds vasovagal  by history.  He is now back to baseline.  He has been here for 6-1/2 hours.  He has a mild headache after striking his head when he fell.  No confusion no vomiting.  No neurologic deficits.  Complaining of some mild low back pain.  Thinks unlikely the patient suffered an L-spine fracture we will hold off on imaging at this time.  Head cleared by [place] head CT rules.  He had blood work obtained through the triage process, no anemia, no significant electrolyte abnormalities.  EKG without concerning finding.  Will discharge the patient home.  PCP follow-up.  5:34 PM:  I have discussed the diagnosis/risks/treatment options with the patient.  Evaluation and diagnostic testing in the emergency department does not suggest an emergent condition requiring admission or immediate intervention beyond what has been performed at this time.  They will follow up with PCP. We also discussed returning to the ED immediately if new or worsening sx occur. We discussed the sx which are most concerning (e.g., sudden worsening pain, fever, inability to tolerate by mouth, recurrent syncope, confusion, vomiting, chest pain, sob) that necessitate immediate return. Medications administered to the patient during their visit and any new prescriptions provided to the patient are listed below.  Medications given during this visit Medications - No data to display   The patient appears reasonably screen and/or stabilized for discharge and I doubt any other medical condition or other [facility] requiring further screening, evaluation, or treatment in the ED at this time prior to discharge.          Final Clinical Impression(s) / ED Diagnoses Final diagnoses:  Syncope and collapse    Rx / DC Orders ED Discharge Orders     None         Melene Plan, DO 05/16/23 1734

## 2023-05-24 ENCOUNTER — Ambulatory Visit: Payer: BC Managed Care – PPO | Admitting: Nurse Practitioner

## 2023-05-24 ENCOUNTER — Encounter: Payer: Self-pay | Admitting: Nurse Practitioner

## 2023-05-24 VITALS — BP 144/73 | HR 59 | Resp 14 | Ht 71.0 in | Wt 288.0 lb

## 2023-05-24 DIAGNOSIS — R55 Syncope and collapse: Secondary | ICD-10-CM | POA: Diagnosis not present

## 2023-05-24 NOTE — Patient Instructions (Addendum)
1. Syncope and collapse  - Ambulatory referral to Cardiology   Follow up:  Follow up in 3 months

## 2023-05-24 NOTE — Progress Notes (Signed)
Subjective   Patient ID: Todd Osborn, male    DOB: March 25, 1961, 62 y.o.   MRN: [id number]  Chief Complaint  Patient presents with   Fall    Referring provider: Ivonne Andrew, NP  Todd Osborn is a 62 y.o. male with Past Medical History: No date: Anemia No date: Anxiety No date: Blood transfusion without reported diagnosis 03/16/2017: Colon abnormality Positive COLOGUARD **POSITIVE** No date: History of cellulitis No date: Hypertension   HPI  Patient presents today for an ED follow-up.  Patient states that on 05/16/2023 he was at work and went out to his car.  When he got to his car he had a syncopal episode and fell and hit his head on the corner of the car.  He did go to the ED overall all labs were clear.  They did not do CT scan of head.  Patient did initially have headaches but this has subsided.  We will place a referral for him today to cardiology.  EKG in ED did show left atrial enlargement. Denies f/c/s, n/v/d, hemoptysis, PND, leg swelling Denies chest pain or edema     Allergies  Allergen Reactions   Amlodipine     Nosebleed    Losartan     Nosebleed    Penicillins Hives    Has patient had a PCN reaction causing immediate rash, facial/tongue/throat swelling, SOB or lightheadedness with hypotension: No Has patient had a PCN reaction causing severe rash involving mucus membranes or skin necrosis: No Has patient had a PCN reaction that required hospitalization: No Has patient had a PCN reaction occurring within the last 10 years: No If all of the above answers are "NO", then may proceed with Cephalosporin use. Pt tolerated Keflex in April 2018    Immunization History  Administered Date(s) Administered   Tdap 10/05/2012    Tobacco History: Social History   Tobacco Use  Smoking Status Former   Current packs/day: 0.00   Types: Cigarettes   Quit date: 07/04/1986   Years since quitting: 36.9  Smokeless Tobacco Never   Counseling given: Not  Answered   Outpatient Encounter Medications as of 05/24/2023  Medication Sig   acetaminophen (TYLENOL) 500 MG tablet Take 1 tablet (500 mg total) by mouth every 6 (six) hours as needed. (Patient not taking: Reported on 08/01/2022)   Multiple Vitamins-Iron (MULTIVITAMIN/IRON PO) Take 1 capsule by mouth daily. (Patient not taking: Reported on 07/20/2022)   Facility-Administered Encounter Medications as of 05/24/2023  Medication   0.9 %  sodium chloride infusion    Review of Systems  Review of Systems  Constitutional: Negative.   HENT: Negative.    Cardiovascular: Negative.   Gastrointestinal: Negative.   Allergic/Immunologic: Negative.   Neurological: Negative.   Psychiatric/Behavioral: Negative.       Objective:   BP (!) 144/73 (BP Location: Right Arm, Patient Position: Sitting, Cuff Size: Large)   Pulse (!) 59   Resp 14   Ht 5\' 11"  (1.803 m)   Wt 288 lb (130.6 kg)   SpO2 99%   BMI 40.17 kg/m   Wt Readings from Last 5 Encounters:  05/24/23 288 lb (130.6 kg)  05/16/23 265 lb (120.2 kg)  01/30/23 282 lb (127.9 kg)  08/01/22 278 lb 6.4 oz (126.3 kg)  08/01/22 277 lb (125.6 kg)     Physical Exam Vitals and nursing note reviewed.  Constitutional:      General: He is not in acute distress.    Appearance: He is well-developed.  Cardiovascular:     Rate and Rhythm: Normal rate and regular rhythm.  Pulmonary:     Effort: Pulmonary effort is normal.     Breath sounds: Normal breath sounds.  Skin:    General: Skin is warm and dry.  Neurological:     Mental Status: He is alert and oriented to person, place, and time.       Assessment & Plan:   Syncope and collapse -     Ambulatory referral to Cardiology     Return in about 3 months (around 08/24/2023).     Ivonne Andrew, NP 05/24/2023

## 2023-06-25 ENCOUNTER — Encounter (HOSPITAL_COMMUNITY): Payer: Self-pay

## 2023-06-25 ENCOUNTER — Other Ambulatory Visit: Payer: Self-pay

## 2023-06-25 ENCOUNTER — Emergency Department (HOSPITAL_COMMUNITY)
Admission: EM | Admit: 2023-06-25 | Discharge: 2023-06-25 | Disposition: A | Discharge status: Home / Self Care | Payer: BC Managed Care – PPO | Attending: Emergency Medicine | Admitting: Emergency Medicine

## 2023-06-25 DIAGNOSIS — K922 Gastrointestinal hemorrhage, unspecified: Secondary | ICD-10-CM | POA: Insufficient documentation

## 2023-06-25 DIAGNOSIS — Z8719 Personal history of other diseases of the digestive system: Secondary | ICD-10-CM | POA: Diagnosis not present

## 2023-06-25 DIAGNOSIS — K625 Hemorrhage of anus and rectum: Secondary | ICD-10-CM | POA: Diagnosis not present

## 2023-06-25 DIAGNOSIS — K921 Melena: Secondary | ICD-10-CM | POA: Diagnosis not present

## 2023-06-25 LAB — CBC
HCT: 41.5 % (ref 39.0–52.0)
HCT: 40.9 % (ref 39.0–52.0)
Hemoglobin: 13.9 g/dL (ref 13.0–17.0)
Hemoglobin: 13.8 g/dL (ref 13.0–17.0)
MCH: 29.8 pg (ref 26.0–34.0)
MCH: 30.1 pg (ref 26.0–34.0)
MCHC: 33.5 g/dL (ref 30.0–36.0)
MCHC: 33.7 g/dL (ref 30.0–36.0)
MCV: 89.1 fL (ref 80.0–100.0)
MCV: 89.1 fL (ref 80.0–100.0)
Platelets: 265 10*3/uL (ref 150–400)
Platelets: 257 10*3/uL (ref 150–400)
RBC: 4.66 MIL/uL (ref 4.22–5.81)
RBC: 4.59 MIL/uL (ref 4.22–5.81)
RDW: 12.7 % (ref 11.5–15.5)
RDW: 12.7 % (ref 11.5–15.5)
WBC: 9 10*3/uL (ref 4.0–10.5)
WBC: 10 10*3/uL (ref 4.0–10.5)
nRBC: 0 % (ref 0.0–0.2)
nRBC: 0 % (ref 0.0–0.2)

## 2023-06-25 LAB — COMPREHENSIVE METABOLIC PANEL
ALT: 23 U/L (ref 0–44)
AST: 18 U/L (ref 15–41)
Albumin: 3.7 g/dL (ref 3.5–5.0)
Alkaline Phosphatase: 41 U/L (ref 38–126)
Anion gap: 8 (ref 5–15)
BUN: 20 mg/dL (ref 8–23)
CO2: 24 mmol/L (ref 22–32)
Calcium: 8.6 mg/dL — ABNORMAL LOW (ref 8.9–10.3)
Chloride: 105 mmol/L (ref 98–111)
Creatinine, Ser: 0.81 mg/dL (ref 0.61–1.24)
GFR, Estimated: >60 mL/min (ref >60)
Glucose, Bld: 128 mg/dL — ABNORMAL HIGH (ref 70–99)
Potassium: 3.9 mmol/L (ref 3.5–5.1)
Sodium: 137 mmol/L (ref 135–145)
Total Bilirubin: 0.3 mg/dL (ref <1.2)
Total Protein: 7.3 g/dL (ref 6.5–8.1)

## 2023-06-25 LAB — TYPE AND SCREEN
ABO/RH(D): B POS
Antibody Screen: NEGATIVE

## 2023-06-25 NOTE — ED Provider Notes (Signed)
[place] EMERGENCY DEPARTMENT AT [facility] Provider Note   CSN: [id number] Arrival date & time: 06/25/23  1255     History  Chief Complaint  Patient presents with   Rectal Bleeding    Todd Osborn is a 62 y.o. male.  HPI    62 year old male with history of diverticulosis with complication of diverticular bleed that required transfusion comes into the emergency room with chief complaint of rectal bleeding.  Patient started having bloody stool yesterday.  He has had 3 total bloody stools since yesterday.  Blood is described as dark, but there is no melena.  He has no associated abdominal pain.  Review of system is negative for any chest pain, shortness of breath, dizziness, weakness.  Patient indicates that in January he had multiple episodes of bloody stool followed by syncope.  He required about 8 units of blood transfusion and ICU admission at that time.  Patient does not take any aspirin, Plavix, NSAIDs or blood thinners.  He denies any history of liver disease or kidney disease.  Home Medications Prior to Admission medications   Medication Sig Start Date End Date Taking? Authorizing Provider  acetaminophen (TYLENOL) 500 MG tablet Take 1 tablet (500 mg total) by mouth every 6 (six) hours as needed. Patient not taking: Reported on 08/01/2022 07/27/21   Rodolph Bong, MD  Multiple Vitamins-Iron (MULTIVITAMIN/IRON PO) Take 1 capsule by mouth daily. Patient not taking: Reported on 07/20/2022    [provider]      Allergies    Amlodipine, Losartan, and Penicillins    Review of Systems   Review of Systems  All other systems reviewed and are negative.   Physical Exam Updated Vital Signs BP (!) 169/82   Pulse 65   Temp 97.7 F (36.5 C)   Resp 18   Ht 5\' 11"  (1.803 m)   Wt 127 kg   SpO2 98%   BMI 39.05 kg/m  Physical Exam Vitals and nursing note reviewed.  Constitutional:      Appearance: He is well-developed.  HENT:     Head:  Atraumatic.  Cardiovascular:     Rate and Rhythm: Normal rate.  Pulmonary:     Effort: Pulmonary effort is normal.  Musculoskeletal:     Cervical back: Neck supple.  Skin:    General: Skin is warm.  Neurological:     Mental Status: He is alert and oriented to person, place, and time.     ED Results / Procedures / Treatments   Labs (all labs ordered are listed, but only abnormal results are displayed) Labs Reviewed  COMPREHENSIVE METABOLIC PANEL - Abnormal; Notable for the following components:      Result Value   Glucose, Bld 128 (*)    Calcium 8.6 (*)    All other components within normal limits  CBC  CBC  POC OCCULT BLOOD, ED  TYPE AND SCREEN    EKG None  Radiology No results found.  Procedures Procedures    Medications Ordered in ED Medications - No data to display  ED Course/ Medical Decision Making/ A&P                                 Medical Decision Making Amount and/or Complexity of Data Reviewed Labs: ordered.  62 year old male with previous history of diverticulosis with complication of diverticular bleed that required multiple units of blood transfusion earlier this year comes in  with chief complaint of bloody stools.  I reviewed patient's previous records including discharge summary, colonoscopy report.  Differential diagnosis for this patient includes: Variceal bleeding PUD/Gastritis/ulcers Diverticular bleed Colon cancer Rectal bleed Internal hemorrhoids External hemorrhoids  I have not proceeded with digital rectal exam given low value of it in this clinical setting.  Patient's hemodynamics are stable with vital signs and are within normal limits.  He is not having any episodes of dizziness or lightheadedness.  I suspect that he is having diverticular bleed again.  In the setting of patient not taking any blood thinners, him not having any significant comorbidities, having stable vital signs, normal hemoglobin of 13.9, patient being  reliable, living close to the hospital -he is a potential candidate for discharge despite recent admission to the hospital for GI bleed.  Upon restratification of lower GI bleed utilizing open score, patient has 95% success rate for safe discharge.  Plan is to observe patient in the ER for serial CBC.  If he has significant hemoglobin drop or he has additional bloody stools then we will proceed with admission request.  Otherwise will be discharged with return precautions.  Patient is comfortable with this plan.  6:55 PM Repeat Hb is reassuring.  No repeat episodes of bloody stools while in the ER.  Patient is comfortable going home.  He was made aware that he is still at risk for worsening of his symptoms, in which case he will have to return.  He is comfortable with this plan.  The patient appears reasonably screened and/or stabilized for discharge and I doubt any other medical condition or other [facility] requiring further screening, evaluation, or treatment in the ED at this time prior to discharge.   Results from the ER workup discussed with the patient face to face and all questions answered to the best of my ability. The patient is safe for discharge with strict return precautions.    Final Clinical Impression(s) / ED Diagnoses Final diagnoses:  History of GI diverticular bleed  Lower GI bleed  Hematochezia    Rx / DC Orders ED Discharge Orders     None         Derwood Kaplan, MD 06/25/23 1857

## 2023-06-25 NOTE — Discharge Instructions (Signed)
You were seen in the emergency for rectal bleeding. We suspect that you are having diverticular bleed.  Your hemoglobin is stable.  Vital signs are also stable.  You have not had a bloody stool since you have been in the ER for over 6 hours now.  At this time, you are stable for discharge. As discussed, we cannot forecast how your symptoms really well over time.  If you start having worsening bleeding, dizziness, near fainting or fainting, chest pain, shortness of breath -please return to the emergency room immediately.  Avoid alcohol, aspirin, ibuprofen/NSAIDs.   Please set up an appointment with your PCP in 1 week.

## 2023-06-25 NOTE — ED Notes (Signed)
Patient d/c with home care instructions. VS obtained.

## 2023-06-25 NOTE — ED Triage Notes (Signed)
Dark red blood in stool since yesterday. No pain. Pt states hx of diverticulitis requiring blood transfusions and this feels the same. Denies other sx, denies diarrhea.

## 2023-07-27 ENCOUNTER — Encounter: Payer: Self-pay | Admitting: Cardiology

## 2023-07-27 ENCOUNTER — Encounter: Payer: Self-pay | Admitting: *Deleted

## 2023-07-27 ENCOUNTER — Ambulatory Visit: Payer: BC Managed Care – PPO | Attending: Cardiology | Admitting: Cardiology

## 2023-07-27 VITALS — BP 148/80 | HR 65 | Resp 16 | Ht 71.0 in | Wt 291.0 lb

## 2023-07-27 DIAGNOSIS — R600 Localized edema: Secondary | ICD-10-CM | POA: Diagnosis not present

## 2023-07-27 DIAGNOSIS — I10 Essential (primary) hypertension: Secondary | ICD-10-CM | POA: Diagnosis not present

## 2023-07-27 DIAGNOSIS — E782 Mixed hyperlipidemia: Secondary | ICD-10-CM | POA: Insufficient documentation

## 2023-07-27 DIAGNOSIS — Z8249 Family history of ischemic heart disease and other diseases of the circulatory system: Secondary | ICD-10-CM | POA: Insufficient documentation

## 2023-07-27 DIAGNOSIS — R55 Syncope and collapse: Secondary | ICD-10-CM

## 2023-07-27 DIAGNOSIS — R0609 Other forms of dyspnea: Secondary | ICD-10-CM | POA: Diagnosis not present

## 2023-07-27 DIAGNOSIS — R9431 Abnormal electrocardiogram [ECG] [EKG]: Secondary | ICD-10-CM

## 2023-07-27 DIAGNOSIS — I447 Left bundle-branch block, unspecified: Secondary | ICD-10-CM | POA: Insufficient documentation

## 2023-07-27 LAB — PRO B NATRIURETIC PEPTIDE: NT-Pro BNP: <36 pg/mL (ref 0–210)

## 2023-07-27 MED ORDER — HYDROCHLOROTHIAZIDE 25 MG PO TABS: 12.5000 mg | ORAL_TABLET | Freq: Every day | ORAL | 3 refills | Status: DC

## 2023-07-27 NOTE — Progress Notes (Signed)
Cardiology Office Note:  .   Date:  07/27/2023  ID:  Todd Osborn, DOB 1961/04/08, MRN [id number] PCP: Ivonne Andrew, NP  [facility] Providers Cardiologist:  Truett Mainland, MD PCP: Ivonne Andrew, NP  Chief Complaint  Patient presents with   Loss of Consciousness   New Patient (Initial Visit)      History of Present Illness: Todd Osborn    KINGSLEY SKLAR is a 63 y.o. 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 He regained consciousness immediately upon hitting the ground. He denies any similar episodes before or since.  He reports drinking 6-8 glasses of water every day, does not drink alcohol or any significant amounts of caffeine.  He does not currently smoke.  He denies chest pain, but reports shortness of breath with exertion, specifically after climbing two flights of stairs, which has been ongoing for the past six months. He also reports leg swelling, noticeable when removing socks in the evening. The patient has a history of diverticulitis, with a recent episode of rectal bleeding that resolved spontaneously. He denies any other medical problems and is not on any medications. He has a significant family history of heart disease, with an older brother who underwent quadruple bypass surgery at age 82 and a younger sister who died of a heart attack.   Vitals:   07/27/23 0850  BP: (!) 148/80  Pulse: 65  Resp: 16  SpO2: 97%   Orthostatic VS for the past 72 hrs (Last 3 readings):  Orthostatic BP Patient Position BP Location Cuff Size Orthostatic Pulse  07/27/23 0902 144/73 Standing Left Arm Large 71  07/27/23 0901 145/79 Sitting Left Arm Large 69   07/27/23 0859 154/85 Supine Left Arm Large 68       ROS:  Review of Systems  Cardiovascular:  Positive for dyspnea on exertion and leg swelling. Negative for chest pain, palpitations and syncope.     Studies Reviewed: Todd Osborn        EKG 07/27/2023: Normal sinus rhythm Left axis deviation Left bundle branch block When compared with ECG of 16-May-2023 11:26, No significant change was found    Independently interpreted 06/2023: HbA1C 5.9% Hb 13.8 Cr 0.81  01/2023: Chol 184, TG 140, HDL 44, LDL 115  Echocardiogram 07/2021: Mild LVH.  EF 55 to 60%.  Grade 2 diastolic function. Mild left atrial dilatation.    Physical Exam:   Physical Exam Vitals and nursing note reviewed.  Constitutional:      General: He is not in acute distress.    Appearance: He is obese.  Neck:     Vascular: No JVD.  Cardiovascular:     Rate and Rhythm: Normal rate and regular rhythm.     Heart sounds: Normal heart sounds. No murmur heard. Pulmonary:     Effort: Pulmonary effort is normal.     Breath sounds: Normal breath sounds. No wheezing or rales.      VISIT DIAGNOSES:   ICD-10-CM   1. Syncope and collapse  R55 EKG 12-Lead    ECHOCARDIOGRAM COMPLETE    2. Exertional dyspnea  R06.09 MYOCARDIAL PERFUSION IMAGING    Pro b natriuretic peptide (BNP)    Cardiac Stress Test:  Informed Consent Details: Physician/Practitioner Attestation; Transcribe to consent form and obtain patient signature    Pro b natriuretic peptide (BNP)    3. Bilateral leg edema  R60.0 Pro b natriuretic peptide (BNP)    Pro b natriuretic peptide (BNP)    4. Primary hypertension  I10     5. Mixed hyperlipidemia  E78.2 CT CARDIAC SCORING (SELF PAY ONLY)    6. Family history of early CAD  Z30.49 CT CARDIAC SCORING (SELF PAY ONLY)    7. Abnormal EKG  R94.31 ECHOCARDIOGRAM COMPLETE    MYOCARDIAL PERFUSION IMAGING       ASSESSMENT AND PLAN: .    Todd Osborn is a 63 y.o. male with hypertension, hyperlipidemia,  family history of CAD, obesity.  Syncope: By description, this most likely appears to have been a possible orthostatic hypotension episode.  He does have left bundle branch block on resting EKG. that said, my suspicion for cardiac etiology remains fairly low.  I will place him on 30-day event monitor.  Dyspnea on exertion: Most likely related to obesity, deconditioning, but also concern for any cardiac etiology.  Especially given his family history of CAD, hyperlipidemia, CAD with angina equivalent, as well as heart failure remained in differential. Recommend echocardiogram, Lexiscan nuclear stress test. Check proBNP, along with BMP and lipid panel. Added hydrochlorothiazide 12.5 mg daily  Mixed hyperlipidemia: Family history of CAD, LDL 115. I will check calcium score scan.  Based on the calcium score scan findings, he will likely need high intensity statin.  Informed Consent   Shared Decision Making/Informed Consent The risks [chest pain, shortness of breath, cardiac arrhythmias, dizziness, blood pressure fluctuations, myocardial infarction, stroke/transient ischemic attack, nausea, vomiting, allergic reaction, radiation exposure, metallic taste sensation and life-threatening complications (estimated to be 1 in 10,000)], benefits (risk stratification, diagnosing coronary artery disease, treatment guidance) and alternatives of a nuclear stress test were discussed in detail with Mr. Rohs and he agrees to proceed.       Meds ordered this encounter  Medications   hydrochlorothiazide (HYDRODIURIL) 25 MG tablet    Sig: Take 0.5 tablets (12.5 mg total) by mouth daily.    Dispense:  45 tablet    Refill:  3     F/u in 3 months  Signed, Elder Negus, MD

## 2023-07-27 NOTE — Patient Instructions (Addendum)
Medication Instructions:   START TAKING hydrochlorothiazide 12.5 MG BY MOUTH DAILY  *If you need a refill on your cardiac medications before your next appointment, please call your pharmacy*   Lab Work:  TODAY--DOWNSTAIRS FIRST FLOOR OF THIS BUILDING AT [facility]--PRO-BNP  If you have labs (blood work) drawn today and your tests are completely normal, you will receive your results only by: MyChart Message (if you have MyChart) OR A paper copy in the mail If you have any lab test that is abnormal or we need to change your treatment, we will call you to review the results.   Testing/Procedures:  Your physician has requested that you have an echocardiogram. Echocardiography is a painless test that uses sound waves to create images of your heart. It provides your doctor with information about the size and shape of your heart and how well your heart's chambers and valves are working. This procedure takes approximately one hour. There are no restrictions for this procedure. Please do NOT wear cologne, perfume, aftershave, or lotions (deodorant is allowed). Please arrive 15 minutes prior to your appointment time.  Please note: We ask at that you not bring children with you during ultrasound (echo/ vascular) testing. Due to room size and safety concerns, children are not allowed in the ultrasound rooms during exams. Our front office staff cannot provide observation of children in our lobby area while testing is being conducted. An adult accompanying a patient to their appointment will only be allowed in the ultrasound room at the discretion of the ultrasound technician under special circumstances. We apologize for any inconvenience.   Your physician has requested that you have a lexiscan myoview. For further information please visit [email or web address] Please follow instruction sheet, as given.   CARDIAC CALCIUM SCORE (SELF PAY)   Your physician has recommended that you wear an event monitor.  Event monitors are medical devices that record the heart's electrical activity. Doctors most often [place] these monitors to diagnose arrhythmias. Arrhythmias are problems with the speed or rhythm of the heartbeat. The monitor is a small, portable device. You can wear one while you do your normal daily activities. This is usually used to diagnose what is causing palpitations/syncope (passing out).  PLEASE HAVE YOUR EVENT MONITOR PLACED THE SAME DAY AS YOU COME IN FOR YOUR ECHO  Follow-Up:  3 MONTHS WITH AN EXTENDER IN THE OFFICE

## 2023-07-27 NOTE — Addendum Note (Signed)
Addended by: Loa Socks on: 07/27/2023 11:49 AM   Modules accepted: Orders

## 2023-07-31 ENCOUNTER — Ambulatory Visit (HOSPITAL_COMMUNITY)
Admission: RE | Admit: 2023-07-31 | Discharge: 2023-07-31 | Disposition: A | Discharge status: Home / Self Care | Payer: Self-pay | Source: Ambulatory Visit | Attending: Cardiology | Admitting: Cardiology

## 2023-07-31 ENCOUNTER — Telehealth: Payer: Self-pay | Admitting: *Deleted

## 2023-07-31 ENCOUNTER — Telehealth (HOSPITAL_COMMUNITY): Payer: Self-pay | Admitting: *Deleted

## 2023-07-31 DIAGNOSIS — Z79899 Other long term (current) drug therapy: Secondary | ICD-10-CM

## 2023-07-31 DIAGNOSIS — E782 Mixed hyperlipidemia: Secondary | ICD-10-CM

## 2023-07-31 DIAGNOSIS — Z8249 Family history of ischemic heart disease and other diseases of the circulatory system: Secondary | ICD-10-CM

## 2023-07-31 MED ORDER — ROSUVASTATIN CALCIUM 20 MG PO TABS: 20.0000 mg | ORAL_TABLET | Freq: Every day | ORAL | 3 refills | Status: AC

## 2023-07-31 NOTE — Telephone Encounter (Signed)
The patient has been notified of the result and verbalized understanding.  All questions (if any) were answered.  Pt aware to start taking crestor 20 mg po daily and come in for repeat lipids in 3 months.   Confirmed the pharmacy of choice with the pt.  Pt aware to have repeat lipid in 3 months to reassess.   Pt states he already has an appt to see Eligha Bridegroom NP for 4/24, and he will stop by the lab on that day to have his lipids drawn.  Order placed and released for then.  Pt aware to go fasting to this lab appt.   Pt verbalized understanding and agrees with this plan.

## 2023-07-31 NOTE — Telephone Encounter (Signed)
Patient given detailed instructions per Myocardial Perfusion Study Information Sheet for the test on 08/07/2023 at 11:00. Patient notified to arrive 15 minutes early and that it is imperative to arrive on time for appointment to keep from having the test rescheduled.  If you need to cancel or reschedule your appointment, please call the office within 24 hours of your appointment. . Patient verbalized understanding.Daneil Dolin

## 2023-07-31 NOTE — Telephone Encounter (Signed)
-----   Message from [facility] sent at 07/31/2023 12:42 PM EST ----- Moderately elevated calcium and heart arteries.  Recommend Crestor 20 mg daily.  Repeat lipid panel in 3 months.  Thanks MJP

## 2023-07-31 NOTE — Progress Notes (Signed)
Moderately elevated calcium and heart arteries.  Recommend Crestor 20 mg daily.  Repeat lipid panel in 3 months.  Thanks MJP

## 2023-08-02 ENCOUNTER — Ambulatory Visit (INDEPENDENT_AMBULATORY_CARE_PROVIDER_SITE_OTHER): Payer: BC Managed Care – PPO | Admitting: Nurse Practitioner

## 2023-08-02 ENCOUNTER — Encounter: Payer: Self-pay | Admitting: Nurse Practitioner

## 2023-08-02 VITALS — BP 142/76 | HR 66 | Temp 97.2°F | Wt 287.2 lb

## 2023-08-02 DIAGNOSIS — E119 Type 2 diabetes mellitus without complications: Secondary | ICD-10-CM

## 2023-08-02 LAB — POCT GLYCOSYLATED HEMOGLOBIN (HGB A1C): Hemoglobin A1C: 5.7 % — AB (ref 4.0–5.6)

## 2023-08-02 NOTE — Patient Instructions (Signed)
1. Type 2 diabetes mellitus without complication, without long-term current use of insulin (HCC) (Primary)  - POCT glycosylated hemoglobin (Hb A1C)   Follow up:  Follow up in 3 months

## 2023-08-02 NOTE — Progress Notes (Signed)
Subjective   Patient ID: Todd Osborn, male    DOB: 1961/02/05, 63 y.o.   MRN: [id number]  Chief Complaint  Patient presents with   Diabetes    Follow up    Referring provider: Ivonne Andrew, NP  Todd Osborn is a 63 y.o. male with Past Medical History: No date: Anemia No date: Anxiety No date: Blood transfusion without reported diagnosis 03/16/2017: Colon abnormality Positive COLOGUARD **POSITIVE** No date: History of cellulitis No date: Hypertension   HPI  Patient presents for a diabetes follow up today. Overall doing well. Trying to do better with diet. No new issues or concerns. A1C today is 5.7. Complaint with medications, diet, and exercise. Denies f/c/s, n/v/d, hemoptysis, PND, leg swelling. Denies chest pain or edema.   Blood pressure elevated in office. Did not take medication today. Patient was started on hydrochlorothiazide 12.5 mg this past Monday by cardiology.  He does have a stress test scheduled for Monday. Advised to take medication when he gets home.    Allergies  Allergen Reactions   Amlodipine     Nosebleed    Losartan     Nosebleed    Penicillins Hives    Has patient had a PCN reaction causing immediate rash, facial/tongue/throat swelling, SOB or lightheadedness with hypotension: No Has patient had a PCN reaction causing severe rash involving mucus membranes or skin necrosis: No Has patient had a PCN reaction that required hospitalization: No Has patient had a PCN reaction occurring within the last 10 years: No If all of the above answers are "NO", then may proceed with Cephalosporin use. Pt tolerated Keflex in April 2018    Immunization History  Administered Date(s) Administered   Tdap 10/05/2012    Tobacco History: Social History   Tobacco Use  Smoking Status Former   Current packs/day: 0.00   Types: Cigarettes   Quit date: 07/04/1986   Years since quitting: 37.1  Smokeless Tobacco Never   Counseling given: Not  Answered   Outpatient Encounter Medications as of 08/02/2023  Medication Sig   hydrochlorothiazide (HYDRODIURIL) 25 MG tablet Take 0.5 tablets (12.5 mg total) by mouth daily.   rosuvastatin (CRESTOR) 20 MG tablet Take 1 tablet (20 mg total) by mouth daily.   acetaminophen (TYLENOL) 500 MG tablet Take 1 tablet (500 mg total) by mouth every 6 (six) hours as needed. (Patient not taking: Reported on 08/02/2023)   Multiple Vitamins-Iron (MULTIVITAMIN/IRON PO) Take 1 capsule by mouth daily. (Patient not taking: Reported on 08/02/2023)   No facility-administered encounter medications on file as of 08/02/2023.    Review of Systems  Review of Systems  Constitutional: Negative.   HENT: Negative.    Cardiovascular: Negative.   Gastrointestinal: Negative.   Allergic/Immunologic: Negative.   Neurological: Negative.   Psychiatric/Behavioral: Negative.       Objective:   BP (!) 142/76   Pulse 66   Temp (!) 97.2 F (36.2 C)   Wt 287 lb 3.2 oz (130.3 kg)   SpO2 93%   BMI 40.06 kg/m   Wt Readings from Last 5 Encounters:  08/02/23 287 lb 3.2 oz (130.3 kg)  07/27/23 291 lb (132 kg)  06/25/23 280 lb (127 kg)  05/24/23 288 lb (130.6 kg)  05/16/23 265 lb (120.2 kg)     Physical Exam Vitals and nursing note reviewed.  Constitutional:      General: He is not in acute distress.    Appearance: He is well-developed.  Cardiovascular:     Rate  and Rhythm: Normal rate and regular rhythm.  Pulmonary:     Effort: Pulmonary effort is normal.     Breath sounds: Normal breath sounds.  Skin:    General: Skin is warm and dry.  Neurological:     Mental Status: He is alert and oriented to person, place, and time.       Assessment & Plan:   Type 2 diabetes mellitus without complication, without long-term current use of insulin (HCC) -     POCT glycosylated hemoglobin (Hb A1C)     Return in about 6 months (around 01/30/2024).   Ivonne Andrew, NP 08/02/2023

## 2023-08-07 ENCOUNTER — Ambulatory Visit (HOSPITAL_COMMUNITY): Payer: BC Managed Care – PPO | Attending: Cardiology

## 2023-08-07 DIAGNOSIS — R0609 Other forms of dyspnea: Secondary | ICD-10-CM | POA: Insufficient documentation

## 2023-08-07 DIAGNOSIS — R9431 Abnormal electrocardiogram [ECG] [EKG]: Secondary | ICD-10-CM | POA: Insufficient documentation

## 2023-08-07 MED ORDER — TECHNETIUM TC 99M TETROFOSMIN IV KIT
31.5000 | PACK | Freq: Once | INTRAVENOUS | Status: AC | PRN
  Administered 2023-08-07: 31.5 via INTRAVENOUS

## 2023-08-07 MED ORDER — REGADENOSON 0.4 MG/5ML IV SOLN
0.4000 mg | Freq: Once | INTRAVENOUS | Status: AC
  Administered 2023-08-07: 0.4 mg via INTRAVENOUS

## 2023-08-08 ENCOUNTER — Ambulatory Visit (HOSPITAL_COMMUNITY): Payer: BC Managed Care – PPO | Attending: Cardiology

## 2023-08-08 DIAGNOSIS — Z79899 Other long term (current) drug therapy: Secondary | ICD-10-CM | POA: Diagnosis not present

## 2023-08-08 DIAGNOSIS — E782 Mixed hyperlipidemia: Secondary | ICD-10-CM | POA: Diagnosis not present

## 2023-08-08 DIAGNOSIS — Z8249 Family history of ischemic heart disease and other diseases of the circulatory system: Secondary | ICD-10-CM | POA: Diagnosis not present

## 2023-08-08 LAB — MYOCARDIAL PERFUSION IMAGING
LV dias vol: 131 mL (ref 62–150)
LV sys vol: 69 mL
Nuc Stress EF: 47 %
Peak HR: 82 {beats}/min
Rest HR: 60 {beats}/min
Rest Nuclear Isotope Dose: 32.8 mCi
SDS: 4
SRS: 1
SSS: 5
ST Depression (mm): 0 mm
Stress Nuclear Isotope Dose: 31.5 mCi
TID: 1.01

## 2023-08-08 MED ORDER — TECHNETIUM TC 99M TETROFOSMIN IV KIT
32.8000 | PACK | Freq: Once | INTRAVENOUS | Status: AC | PRN
  Administered 2023-08-08: 32.8 via INTRAVENOUS

## 2023-08-09 LAB — LIPID PANEL
Chol/HDL Ratio: 2.9 {ratio} (ref 0.0–5.0)
Cholesterol, Total: 114 mg/dL (ref 100–199)
HDL: 40 mg/dL (ref >39)
LDL Chol Calc (NIH): 50 mg/dL (ref 0–99)
Triglycerides: 139 mg/dL (ref 0–149)
VLDL Cholesterol Cal: 24 mg/dL (ref 5–40)

## 2023-08-16 ENCOUNTER — Ambulatory Visit (HOSPITAL_COMMUNITY): Payer: BC Managed Care – PPO | Attending: Cardiology

## 2023-08-16 ENCOUNTER — Ambulatory Visit (INDEPENDENT_AMBULATORY_CARE_PROVIDER_SITE_OTHER): Payer: BC Managed Care – PPO

## 2023-08-16 ENCOUNTER — Encounter: Payer: Self-pay | Admitting: Cardiology

## 2023-08-16 DIAGNOSIS — R9431 Abnormal electrocardiogram [ECG] [EKG]: Secondary | ICD-10-CM | POA: Insufficient documentation

## 2023-08-16 DIAGNOSIS — R55 Syncope and collapse: Secondary | ICD-10-CM

## 2023-08-16 LAB — ECHOCARDIOGRAM COMPLETE
Area-P 1/2: 3.66 cm2
S' Lateral: 3.3 cm

## 2023-08-16 MED ORDER — PERFLUTREN LIPID MICROSPHERE
1.0000 mL | INTRAVENOUS | Status: AC | PRN
  Administered 2023-08-16: 2 mL via INTRAVENOUS

## 2023-08-16 NOTE — Progress Notes (Signed)
Normal pumping and valvular function of the heart.  Moderate stiffening of the heart, likely due to obesity and hypertension.  Please check if symptoms and blood pressure are better after starting hydrochlorothiazide 12.5 mg daily. Her blood pressure can be rechecked.  Given his history of syncope, I am slightly hesitant to increase dosages without further in office evaluation.  He has an appointment with Alden Server in April.  Okay to keep that.  Thanks MJP

## 2023-08-16 NOTE — Progress Notes (Unsigned)
[employer]ips event monitor serial # [id number] from office inventory applied to patient.

## 2023-08-17 NOTE — Progress Notes (Signed)
Noted.  Thanks MJP

## 2023-08-24 ENCOUNTER — Ambulatory Visit: Payer: Self-pay | Admitting: Nurse Practitioner

## 2023-09-26 ENCOUNTER — Encounter: Payer: Self-pay | Admitting: Cardiology

## 2023-10-25 NOTE — Progress Notes (Unsigned)
 Cardiology Office Note    Patient Name: Todd Osborn Date of Encounter: 10/25/2023  Primary Care Provider:  Jerrlyn Morel, NP Primary Cardiologist:  None Primary Electrophysiologist: None   Past Medical History    Past Medical History:  Diagnosis Date   Anemia    Anxiety    Blood transfusion without reported diagnosis    Colon abnormality Positive COLOGUARD **POSITIVE** 03/16/2017   History of cellulitis    Hypertension     History of Present Illness  Todd Osborn is a 63 y.o. male with a PMH of HTN, LBBB, HLD family history of CAD, obesity presents today for 11-month follow-up of syncope.  Mr. Radigan was previously seen by Dr. Felipe Horton and is currently followed by Dr.Patwarhdan for complaint of syncope and exertional shortness of breath.  He was noted to have elevated BP and was started on HCTZ 12.5 mg.  He has a family history of CAD with LBBB on EKG and patient wore a 30-day event monitor that showed no significant abnormalities or malignant arrhythmias.  He completed CT calcium  scoring that showed a score of 155 with initiation of Crestor  20 mg recommended.  He completed a Lexiscan  Myoview  that showed a small inferior apical area of infarction with test being deemed intermediate risk and mildly reduced EF.  He underwent a 2D echo for further evaluation of the ejection fraction that showed normal EF of 55% with mild LVH and paradoxical septal motion consistent with LBBB with grade 2 DD and no significant valve abnormalities.  Patient was advised to continue to monitor blood pressures at home.  Patient was advised to monitor blood pressures at home.  Patient denies chest pain, palpitations, dyspnea, PND, orthopnea, nausea, vomiting, dizziness, syncope, edema, weight gain, or early satiety.   Discussed the use of AI scribe software for clinical note transcription with the patient, who gave verbal consent to proceed.  History of Present Illness    ***Notes: -Last ischemic  evaluation:  Review of Systems  Please see the history of present illness.    All other systems reviewed and are otherwise negative except as noted above.  Physical Exam    Wt Readings from Last 3 Encounters:  08/07/23 291 lb (132 kg)  08/02/23 287 lb 3.2 oz (130.3 kg)  07/27/23 291 lb (132 kg)   ZO:XWRUE were no vitals filed for this visit.,There is no height or weight on file to calculate BMI. GEN: Well nourished, well developed in no acute distress Neck: No JVD; No carotid bruits Pulmonary: Clear to auscultation without rales, wheezing or rhonchi  Cardiovascular: Normal rate. Regular rhythm. Normal S1. Normal S2.   Murmurs: There is no murmur.  ABDOMEN: Soft, non-tender, non-distended EXTREMITIES:  No edema; No deformity   EKG/LABS/ Recent Cardiac Studies   ECG personally reviewed by me today - ***  Risk Assessment/Calculations:   {Does this patient have ATRIAL FIBRILLATION?[phone]}      Lab Results  Component Value Date   WBC 10.0 06/25/2023   HGB 13.8 06/25/2023   HCT 40.9 06/25/2023   MCV 89.1 06/25/2023   PLT 257 06/25/2023   Lab Results  Component Value Date   CREATININE 0.81 06/25/2023   BUN 20 06/25/2023   NA 137 06/25/2023   K 3.9 06/25/2023   CL 105 06/25/2023   CO2 24 06/25/2023   Lab Results  Component Value Date   CHOL 114 08/08/2023   HDL 40 08/08/2023   LDLCALC 50 08/08/2023   TRIG 139 08/08/2023  CHOLHDL 2.9 08/08/2023    Lab Results  Component Value Date   HGBA1C 5.7 (A) 08/02/2023   Assessment & Plan    1.  Primary HTN:  2.  Diastolic dysfunction:  3.  Mixed HLD:  4.  LBBB:  5.  Syncope and collapse:      Disposition: Follow-up with None or APP in *** months {Are you ordering a CV Procedure (e.g. stress test, cath, DCCV, TEE, etc)?   Press F2        :[id number]}   Signed, Francene Ing, Retha Cast, NP 10/25/2023, 8:01 AM [facility]

## 2023-10-26 ENCOUNTER — Ambulatory Visit: Payer: BC Managed Care – PPO | Attending: Nurse Practitioner | Admitting: Nurse Practitioner

## 2023-10-26 ENCOUNTER — Encounter: Payer: Self-pay | Admitting: Nurse Practitioner

## 2023-10-26 ENCOUNTER — Other Ambulatory Visit: Payer: Self-pay

## 2023-10-26 VITALS — BP 142/88 | HR 53 | Ht 71.0 in | Wt 293.0 lb

## 2023-10-26 DIAGNOSIS — I5189 Other ill-defined heart diseases: Secondary | ICD-10-CM

## 2023-10-26 DIAGNOSIS — I10 Essential (primary) hypertension: Secondary | ICD-10-CM | POA: Diagnosis not present

## 2023-10-26 DIAGNOSIS — E782 Mixed hyperlipidemia: Secondary | ICD-10-CM

## 2023-10-26 DIAGNOSIS — Z8249 Family history of ischemic heart disease and other diseases of the circulatory system: Secondary | ICD-10-CM | POA: Diagnosis not present

## 2023-10-26 DIAGNOSIS — I2510 Atherosclerotic heart disease of native coronary artery without angina pectoris: Secondary | ICD-10-CM

## 2023-10-26 DIAGNOSIS — R55 Syncope and collapse: Secondary | ICD-10-CM

## 2023-10-26 DIAGNOSIS — I447 Left bundle-branch block, unspecified: Secondary | ICD-10-CM

## 2023-10-26 LAB — LIPID PANEL
Chol/HDL Ratio: 3.3 ratio (ref 0.0–5.0)
Cholesterol, Total: 140 mg/dL (ref 100–199)
HDL: 43 mg/dL (ref >39)
LDL Chol Calc (NIH): 79 mg/dL (ref 0–99)
Triglycerides: 95 mg/dL (ref 0–149)
VLDL Cholesterol Cal: 18 mg/dL (ref 5–40)

## 2023-10-26 LAB — HEPATIC FUNCTION PANEL
ALT: 22 IU/L (ref 0–44)
AST: 21 IU/L (ref 0–40)
Albumin: 4.1 g/dL (ref 3.9–4.9)
Alkaline Phosphatase: 56 IU/L (ref 44–121)
Bilirubin Total: 0.3 mg/dL (ref 0.0–1.2)
Bilirubin, Direct: 0.1 mg/dL (ref 0.00–0.40)
Total Protein: 7.1 g/dL (ref 6.0–8.5)

## 2023-10-26 MED ORDER — HYDROCHLOROTHIAZIDE 25 MG PO TABS: 25.0000 mg | ORAL_TABLET | Freq: Every day | ORAL | 3 refills | Status: DC

## 2023-10-26 NOTE — Patient Instructions (Addendum)
 Medication Instructions:  INCREASE Hydrochlorothiazide  25mg  Take 1 tablet once a day  *If you need a refill on your cardiac medications before your next appointment, please call your pharmacy*  Lab Work: TODAY-LIPIDS & LFTS 2 WEEKS BMET (COMPLETE AROUND Nov 09, 2023 OR WEEK OF) If you have labs (blood work) drawn today and your tests are completely normal, you will receive your results only by: MyChart Message (if you have MyChart) OR A paper copy in the mail If you have any lab test that is abnormal or we need to change your treatment, we will call you to review the results.  Testing/Procedures: NONE ORDERED  Follow-Up: At [facility], you and your health needs are our priority.  As part of our continuing mission to provide you with exceptional heart care, our providers are all part of one team.  This team includes your primary Cardiologist (physician) and Advanced Practice Providers or APPs (Physician Assistants and Nurse Practitioners) who all work together to provide you with the care you need, when you need it.  Your next appointment:   6 month(s)  Provider:   Charles Connor, NP   We recommend signing up for the patient portal called "MyChart".  Sign up information is provided on this After Visit Summary.  MyChart is used to connect with patients for Virtual Visits (Telemedicine).  Patients are able to view lab/test results, encounter notes, upcoming appointments, etc.  Non-urgent messages can be sent to your provider as well.   To learn more about what you can do with MyChart, go to [email or web address].   Other Instructions [email or web address] GET A OMRON UPPER ARM BLOOD PRESSURE CUFF Check your blood pressure daily for 2 weeks, then contact the office with your readings.  Contact the office either by phone or MyChart with your readings.  Make sure to check your blood pressure 2 hours after taking your medications.   AVOID these things for 30 minutes before checking your  blood pressure: No Drinking caffeine. No Drinking alcohol. No Eating. No Smoking. No Exercising.  Five minutes before checking your blood pressure: Pee. Sit in a dining chair. Avoid sitting in a soft couch or armchair. Be quiet. Do not talk.       1st Floor: - Lobby - Registration  - Pharmacy  - Lab - Cafe  2nd Floor: - PV Lab - Diagnostic Testing (echo, CT, nuclear med)  3rd Floor: - Vacant  4th Floor: - TCTS (cardiothoracic surgery) - AFib Clinic - Structural Heart Clinic - Vascular Surgery  - Vascular Ultrasound  5th Floor: - HeartCare Cardiology (general and EP) - Clinical Pharmacy for coumadin, hypertension, lipid, weight-loss medications, and med management appointments    Valet parking services will be available as well.

## 2023-11-07 DIAGNOSIS — I2510 Atherosclerotic heart disease of native coronary artery without angina pectoris: Secondary | ICD-10-CM | POA: Diagnosis not present

## 2023-11-07 DIAGNOSIS — I10 Essential (primary) hypertension: Secondary | ICD-10-CM | POA: Diagnosis not present

## 2023-11-07 DIAGNOSIS — E782 Mixed hyperlipidemia: Secondary | ICD-10-CM | POA: Diagnosis not present

## 2023-11-07 DIAGNOSIS — Z8249 Family history of ischemic heart disease and other diseases of the circulatory system: Secondary | ICD-10-CM | POA: Diagnosis not present

## 2023-11-08 LAB — BASIC METABOLIC PANEL WITH GFR
BUN/Creatinine Ratio: 15 (ref 10–24)
BUN: 13 mg/dL (ref 8–27)
CO2: 23 mmol/L (ref 20–29)
Calcium: 9.1 mg/dL (ref 8.6–10.2)
Chloride: 103 mmol/L (ref 96–106)
Creatinine, Ser: 0.85 mg/dL (ref 0.76–1.27)
Glucose: 105 mg/dL — ABNORMAL HIGH (ref 70–99)
Potassium: 4.2 mmol/L (ref 3.5–5.2)
Sodium: 140 mmol/L (ref 134–144)
eGFR: 97 mL/min/{1.73_m2} (ref >59)

## 2024-01-17 ENCOUNTER — Encounter: Payer: Self-pay | Admitting: Nurse Practitioner

## 2024-01-17 ENCOUNTER — Ambulatory Visit: Admitting: Nurse Practitioner

## 2024-01-17 ENCOUNTER — Ambulatory Visit: Payer: Self-pay

## 2024-01-17 VITALS — BP 143/71 | HR 67 | Temp 98.4°F | Wt 291.0 lb

## 2024-01-17 DIAGNOSIS — R600 Localized edema: Secondary | ICD-10-CM

## 2024-01-17 DIAGNOSIS — E119 Type 2 diabetes mellitus without complications: Secondary | ICD-10-CM | POA: Diagnosis not present

## 2024-01-17 DIAGNOSIS — M79605 Pain in left leg: Secondary | ICD-10-CM | POA: Insufficient documentation

## 2024-01-17 DIAGNOSIS — M25562 Pain in left knee: Secondary | ICD-10-CM | POA: Insufficient documentation

## 2024-01-17 LAB — POCT GLYCOSYLATED HEMOGLOBIN (HGB A1C): Hemoglobin A1C: 5.9 % — AB (ref 4.0–5.6)

## 2024-01-17 MED ORDER — PANTOPRAZOLE SODIUM 40 MG PO TBEC: 40.0000 mg | DELAYED_RELEASE_TABLET | Freq: Every day | ORAL | 0 refills | Status: AC

## 2024-01-17 MED ORDER — METHYLPREDNISOLONE 4 MG PO TBPK: ORAL_TABLET | ORAL | 0 refills | Status: AC

## 2024-01-17 NOTE — Assessment & Plan Note (Signed)
 Solu-Medrol  40 mg and Toradol  30 mg injection given in the office today Short course of methylprednisolone  ordered to be taken with food and  pantoprazole  40 mg daily due to his history of GI bleed Advised to take Tylenol  650 mg every 6 hours as needed, application of heating pad also encouraged

## 2024-01-17 NOTE — Telephone Encounter (Signed)
 FYI Only or Action Required?: FYI only for provider.  Patient was last seen in primary care on 08/02/2023 by Todd Bascom RAMAN, NP.  Called Nurse Triage reporting Leg Swelling.  Symptoms began several days ago.  Interventions attempted: OTC medications: Tylenol  .  Symptoms are: unchanged.  Triage Disposition: See Physician Within 24 Hours  Patient/caregiver understands and will follow disposition?: Yes   **Appt scheduled for 7/16 at 2:20pm**                            Copied from CRM [id number]. Topic: Clinical - Red Word Triage >> Jan 17, 2024 11:40 AM [name] wrote: Red Word that prompted transfer to Nurse Triage: Todd Osborn last week and hit left knee and his leg is now swelling. Been swollen for 2-3 days. Hurting now ans notes bruise in the back of his leg. Reason for Disposition  [1] MODERATE leg swelling (e.g., swelling extends up to knees) AND [2] new-onset or getting worse  Answer Assessment - Initial Assessment Questions 1. ONSET: When did the swelling start? (e.g., minutes, hours, days)      Todd Osborn last week and hit left knee and his leg is now swelling.  2. LOCATION: What part of the leg is swollen?  Are both legs swollen or just one leg?     Left leg swelling down to ankle, redness on shin   3. SEVERITY: How bad is the swelling? (e.g., localized; mild, moderate, severe)     Moderate   4. REDNESS: Is there redness or signs of infection?     Left shin area with bruising    5. PAIN: Is the swelling painful to touch? If Yes, ask: How painful is it?   (Scale 1-10; mild, moderate or severe)     8-10    6. FEVER: Do you have a fever? If Yes, ask: What is it, how was it measured, and when did it start?      No    7. CAUSE: What do you think is causing the leg swelling?     Fall from last week    8. MEDICAL HISTORY: Do you have a history of blood clots (e.g., DVT), cancer, heart failure, kidney disease, or liver failure?      HTN   9. RECURRENT SYMPTOM: Have you had leg swelling before? If Yes, ask: When was the last time? What happened that time?     No    10. OTHER SYMPTOMS: Do you have any other symptoms? (e.g., chest pain, difficulty breathing)  No   Patient took Tylenol  for the pain the day before yesterday.  Protocols used: Leg Swelling and Edema-A-AH

## 2024-01-17 NOTE — Assessment & Plan Note (Addendum)
 Currently controlled without medication A1c 5.9 today

## 2024-01-17 NOTE — Patient Instructions (Addendum)
 You were give given Toradol  30 mg and Solu-Medrol  40 mg injection in the office today., take prednisone  from tomoorrow take medication with food  and pantoprazole  40mg  daily  to protect your stomach. Take Tylenol  650 mg every 6 hours as needed.  I also encourage application of heating pad or ice  For the swelling in your lower extremities, be sure to elevate your legs when able, mind the salt intake, stay physically active and consider wearing compression stockings.    It is important that you exercise regularly at least 30 minutes 5 times a week as tolerated  Think about what you will eat, plan ahead. Choose  clean, green, fresh or frozen over canned, processed or packaged foods which are more sugary, salty and fatty. 70 to 75% of food eaten should be vegetables and fruit. Three meals at set times with snacks allowed between meals, but they must be fruit or vegetables. Aim to eat over a 12 hour period , example 7 am to 7 pm, and STOP after  your last meal of the day. Drink water,generally about 64 ounces per day, no other drink is as healthy. Fruit juice is best enjoyed in a healthy way, by EATING the fruit.  Thanks for choosing [facility] we consider it a privelige to serve you.

## 2024-01-17 NOTE — Progress Notes (Signed)
 Acute Office Visit  Subjective:     Patient ID: Todd Osborn, male    DOB: 06-19-1961, 63 y.o.   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    Patient presents with complaints of left knee pain and left leg pain that started 4 days ago, he had fallen while climbing the stairs about a week ago, stated that he landed on his left knee when he fell, has been taking Tylenol  as needed at home.  His pain is currently rated 7/10.  No fever, chills, numbness, tingling.     Review of Systems      Objective:    BP (!) 143/71   Pulse 67   Temp 98.4 F (36.9 C)   Wt 291 lb (132 kg)   SpO2 95%   BMI 40.59 kg/m    Physical Exam Vitals and nursing note reviewed.  Constitutional:      General: He is not in acute distress.    Appearance: Normal appearance. He is obese. He is not ill-appearing, toxic-appearing or diaphoretic.  Eyes:     General: No scleral icterus.       Right eye: No discharge.        Left eye: No discharge.     Extraocular Movements: Extraocular movements intact.     Conjunctiva/sclera: Conjunctivae normal.  Cardiovascular:     Rate and Rhythm: Normal rate and regular rhythm.     Pulses: Normal pulses.     Heart sounds: Normal heart sounds. No murmur heard.    No friction rub. No gallop.  Pulmonary:     Effort: Pulmonary effort is normal. No respiratory distress.     Breath sounds: Normal breath sounds. No stridor. No wheezing, rhonchi or rales.  Chest:     Chest wall: No tenderness.  Abdominal:     General: There is no distension.     Palpations: Abdomen is soft.     Tenderness: There is no abdominal tenderness. There is no right CVA tenderness, left CVA tenderness or guarding.   Musculoskeletal:        General: Swelling and tenderness present.     Right lower leg: No edema.     Left lower leg: Edema present.     Comments: Tenderness on palpation of left knee and anterior left leg, mild edema and erythema noted, skin is warm and dry has palpable pedal pulse  Skin:    General: Skin is warm and dry.     Capillary Refill: Capillary refill takes less than 2 seconds.     Coloration: Skin is not jaundiced or pale.     Findings: No bruising, erythema or lesion.  Neurological:     Mental Status: He is alert and oriented to person, place, and time.     Motor: No weakness.     Coordination: Coordination normal.     Gait: Gait normal.  Psychiatric:        Mood and Affect: Mood normal.        Behavior: Behavior normal.        Thought Content: Thought content normal.        Judgment: Judgment normal.     Results for orders placed or performed in visit on  01/17/24  POCT glycosylated hemoglobin (Hb A1C)  Result Value Ref Range   Hemoglobin A1C 5.9 (A) 4.0 - 5.6 %   HbA1c POC (<> result, manual entry)     HbA1c, POC (prediabetic range)     HbA1c, POC (controlled diabetic range)          Assessment & Plan:   Problem List Items Addressed This Visit       Endocrine   Type 2 diabetes mellitus without complication, without long-term current use of insulin (HCC)   Currently controlled without medication A1c 5.9 today      Relevant Orders   POCT glycosylated hemoglobin (Hb A1C) (Completed)     Other   Acute pain of left knee - Primary   Solu-Medrol  40 mg and Toradol  30 mg injection given in the office today Short course of methylprednisolone  ordered to be taken with food and  pantoprazole  40 mg daily due to his history of GI bleed Advised to take Tylenol  650 mg every 6 hours as needed, application of heating pad also encouraged      Relevant Medications   methylPREDNISolone  (MEDROL  DOSEPAK) 4 MG TBPK tablet (Start on 01/18/2024)   pantoprazole  (PROTONIX ) 40  MG tablet (Start on 01/18/2024)   Acute leg pain, left   Solu-Medrol  40 mg and Toradol  30 mg injection given in the office today Short course of methylprednisolone  ordered to be taken with food and  pantoprazole  40 mg daily due to his history of GI bleed Advised to take Tylenol  650 mg every 6 hours as needed, application of heating pad also encouraged      Relevant Medications   methylPREDNISolone  (MEDROL  DOSEPAK) 4 MG TBPK tablet (Start on 01/18/2024)   pantoprazole  (PROTONIX ) 40 MG tablet (Start on 01/18/2024)   Leg edema, left   Elevation, DASH diet, use of compression socks encouraged       Meds ordered this encounter  Medications   methylPREDNISolone  (MEDROL  DOSEPAK) 4 MG TBPK tablet    Sig: Take as instructed on the packaging    Dispense:  1 each    Refill:  0   pantoprazole  (PROTONIX ) 40 MG tablet    Sig: Take 1 tablet (40 mg total) by mouth daily.    Dispense:  5 tablet    Refill:  0    No follow-ups on file.  Todd Mcmasters R Gwen Sarvis, FNP

## 2024-01-17 NOTE — Assessment & Plan Note (Signed)
 Elevation, DASH diet, use of compression socks encouraged

## 2024-01-19 DIAGNOSIS — M79605 Pain in left leg: Secondary | ICD-10-CM

## 2024-01-19 DIAGNOSIS — M25562 Pain in left knee: Secondary | ICD-10-CM | POA: Diagnosis not present

## 2024-01-19 MED ORDER — METHYLPREDNISOLONE NA SUC (PF) 40 MG IJ SOLR
40.0000 mg | Freq: Once | INTRAMUSCULAR | Status: AC
  Administered 2024-01-17: 40 mg via INTRAMUSCULAR

## 2024-01-19 MED ORDER — KETOROLAC TROMETHAMINE 30 MG/ML IJ SOLN
30.0000 mg | Freq: Once | INTRAMUSCULAR | Status: AC
  Administered 2024-01-19: 30 mg via INTRAMUSCULAR

## 2024-01-19 NOTE — Addendum Note (Signed)
 Addended by: VICTORY IHA on: 01/19/2024 07:39 AM   Modules accepted: Orders

## 2024-01-21 ENCOUNTER — Emergency Department (HOSPITAL_COMMUNITY)
Admission: EM | Admit: 2024-01-21 | Discharge: 2024-01-21 | Disposition: A | Discharge status: Home / Self Care | Attending: Emergency Medicine | Admitting: Emergency Medicine

## 2024-01-21 ENCOUNTER — Encounter (HOSPITAL_COMMUNITY)

## 2024-01-21 ENCOUNTER — Encounter (HOSPITAL_COMMUNITY): Payer: Self-pay

## 2024-01-21 DIAGNOSIS — L539 Erythematous condition, unspecified: Secondary | ICD-10-CM | POA: Diagnosis not present

## 2024-01-21 DIAGNOSIS — M7989 Other specified soft tissue disorders: Secondary | ICD-10-CM | POA: Insufficient documentation

## 2024-01-21 DIAGNOSIS — R600 Localized edema: Secondary | ICD-10-CM | POA: Insufficient documentation

## 2024-01-21 DIAGNOSIS — D72829 Elevated white blood cell count, unspecified: Secondary | ICD-10-CM | POA: Insufficient documentation

## 2024-01-21 DIAGNOSIS — I10 Essential (primary) hypertension: Secondary | ICD-10-CM | POA: Insufficient documentation

## 2024-01-21 DIAGNOSIS — Z79899 Other long term (current) drug therapy: Secondary | ICD-10-CM | POA: Insufficient documentation

## 2024-01-21 DIAGNOSIS — R2240 Localized swelling, mass and lump, unspecified lower limb: Secondary | ICD-10-CM | POA: Diagnosis not present

## 2024-01-21 LAB — CBC WITH DIFFERENTIAL/PLATELET
Abs Immature Granulocytes: 0.04 K/uL (ref 0.00–0.07)
Basophils Absolute: 0 K/uL (ref 0.0–0.1)
Basophils Relative: 0 %
Eosinophils Absolute: 0.2 K/uL (ref 0.0–0.5)
Eosinophils Relative: 1 %
HCT: 42.2 % (ref 39.0–52.0)
Hemoglobin: 13.4 g/dL (ref 13.0–17.0)
Immature Granulocytes: 0 %
Lymphocytes Relative: 12 %
Lymphs Abs: 1.4 K/uL (ref 0.7–4.0)
MCH: 27.9 pg (ref 26.0–34.0)
MCHC: 31.8 g/dL (ref 30.0–36.0)
MCV: 87.7 fL (ref 80.0–100.0)
Monocytes Absolute: 0.8 K/uL (ref 0.1–1.0)
Monocytes Relative: 6 %
Neutro Abs: 9.3 K/uL — ABNORMAL HIGH (ref 1.7–7.7)
Neutrophils Relative %: 81 %
Platelets: 273 K/uL (ref 150–400)
RBC: 4.81 MIL/uL (ref 4.22–5.81)
RDW: 13.2 % (ref 11.5–15.5)
WBC: 11.6 K/uL — ABNORMAL HIGH (ref 4.0–10.5)
nRBC: 0 % (ref 0.0–0.2)

## 2024-01-21 LAB — BASIC METABOLIC PANEL WITH GFR
Anion gap: 9 (ref 5–15)
BUN: 22 mg/dL (ref 8–23)
CO2: 26 mmol/L (ref 22–32)
Calcium: 9 mg/dL (ref 8.9–10.3)
Chloride: 103 mmol/L (ref 98–111)
Creatinine, Ser: 1.2 mg/dL (ref 0.61–1.24)
GFR, Estimated: >60 mL/min (ref >60)
Glucose, Bld: 150 mg/dL — ABNORMAL HIGH (ref 70–99)
Potassium: 4 mmol/L (ref 3.5–5.1)
Sodium: 138 mmol/L (ref 135–145)

## 2024-01-21 MED ORDER — CEPHALEXIN 500 MG PO CAPS
500.0000 mg | ORAL_CAPSULE | Freq: Once | ORAL | Status: AC
  Administered 2024-01-21: 500 mg via ORAL
  Filled 2024-01-21: qty 1

## 2024-01-21 MED ORDER — CEPHALEXIN 500 MG PO CAPS: 500.0000 mg | ORAL_CAPSULE | Freq: Four times a day (QID) | ORAL | 0 refills | Status: AC

## 2024-01-21 NOTE — Discharge Instructions (Signed)
 You will need to follow-up with your primary care provider in the next 48 to 72 hours.  Seek emergency care if experiencing any new or worsening symptoms.  Alternating between 650 mg Tylenol  and 400 mg Advil : The best way to alternate taking Acetaminophen  (example Tylenol ) and Ibuprofen  (example Advil /Motrin ) is to take them 3 hours apart. For example, if you take ibuprofen  at 6 am you can then take Tylenol  at 9 am. You can continue this regimen throughout the day, making sure you do not exceed the recommended maximum dose for each drug.

## 2024-01-21 NOTE — ED Triage Notes (Signed)
 Pt has pain, redness, and pitting edema to the LLE, ongoing for 3 weeks.

## 2024-01-21 NOTE — ED Provider Notes (Signed)
 [place] EMERGENCY DEPARTMENT AT [facility] Provider Note   CSN: [id number] Arrival date & time: 01/21/24  1735     Patient presents with: No chief complaint on file.   Todd Osborn is a 63 y.o. male with PMHx anemia, HTN, HLD, who presents to ED concerned for left lower leg swelling, erythema, and pain x2 weeks. Denies pruritus. Denies fever, chest pain, SOB, nausea, vomiting, diarrhea. Patient not on blood thinners and denies hx of CHF.   HPI     Prior to Admission medications   Medication Sig Start Date End Date Taking? Authorizing Provider  cephALEXin  (KEFLEX ) 500 MG capsule Take 1 capsule (500 mg total) by mouth 4 (four) times daily for 7 days. 01/21/24 01/28/24 Yes Jeaneen Cala, Nidia F, PA-C  acetaminophen  (TYLENOL ) 500 MG tablet Take 1 tablet (500 mg total) by mouth every 6 (six) hours as needed. 07/27/21   Sebastian Toribio GAILS, MD  hydrochlorothiazide  (HYDRODIURIL ) 25 MG tablet Take 1 tablet (25 mg total) by mouth daily. 10/26/23   Wyn Jackee VEAR Mickey., NP  methylPREDNISolone  (MEDROL  DOSEPAK) 4 MG TBPK tablet Take as instructed on the packaging 01/18/24   Paseda, Folashade R, FNP  Multiple Vitamins-Iron  (MULTIVITAMIN/IRON  PO) Take 1 capsule by mouth daily. Patient not taking: Reported on 01/17/2024    [provider]  pantoprazole  (PROTONIX ) 40 MG tablet Take 1 tablet (40 mg total) by mouth daily. 01/18/24   Paseda, Folashade R, FNP  rosuvastatin  (CRESTOR ) 20 MG tablet Take 1 tablet (20 mg total) by mouth daily. 07/31/23   Patwardhan, Newman PARAS, MD    Allergies: Penicillins, Amlodipine , and Losartan     Review of Systems  Skin:  Positive for rash.    Updated Vital Signs BP (!) 149/77   Pulse 68   Temp 98.2 F (36.8 C) (Oral)   Resp 18   Ht 5' 11 (1.803 m)   Wt 131.5 kg   SpO2 94%   BMI 40.45 kg/m   Physical Exam Vitals and nursing note reviewed.  Constitutional:      General: He is not in acute distress.    Appearance: He is not ill-appearing or  toxic-appearing.  HENT:     Head: Normocephalic and atraumatic.     Mouth/Throat:     Mouth: Mucous membranes are moist.  Eyes:     General: No scleral icterus.       Right eye: No discharge.        Left eye: No discharge.     Conjunctiva/sclera: Conjunctivae normal.  Cardiovascular:     Rate and Rhythm: Normal rate and regular rhythm.     Pulses: Normal pulses.     Heart sounds: Normal heart sounds. No murmur heard. Pulmonary:     Effort: Pulmonary effort is normal. No respiratory distress.     Breath sounds: Normal breath sounds. No wheezing, rhonchi or rales.  Abdominal:     General: Abdomen is flat. Bowel sounds are normal. There is no distension.     Palpations: Abdomen is soft. There is no mass.     Tenderness: There is no abdominal tenderness.  Musculoskeletal:     Right lower leg: No edema.     Left lower leg: No edema.     Comments: +1 pitting edema on left lower leg. Lower leg is also erythematous. Area non-tense and appears NV intact.  Skin:    General: Skin is warm and dry.     Findings: No rash.  Neurological:  General: No focal deficit present.     Mental Status: He is alert. Mental status is at baseline.  Psychiatric:        Mood and Affect: Mood normal.     (all labs ordered are listed, but only abnormal results are displayed) Labs Reviewed  CBC WITH DIFFERENTIAL/PLATELET - Abnormal; Notable for the following components:      Result Value   WBC 11.6 (*)    Neutro Abs 9.3 (*)    All other components within normal limits  BASIC METABOLIC PANEL WITH GFR - Abnormal; Notable for the following components:   Glucose, Bld 150 (*)    All other components within normal limits    EKG: None  Radiology: No results found.   Procedures   Medications Ordered in the ED  cephALEXin  (KEFLEX ) capsule 500 mg (has no administration in time range)                                    Medical Decision Making Amount and/or Complexity of Data Reviewed Labs:  ordered.   This patient presents to the ED for concern of left leg pain, this involves an extensive number of treatment options, and is a complaint that carries with it a high risk of complications and morbidity.  The differential diagnosis includes hemarthrosis, gout, septic joint, fracture, tendonitis, muscle strain, bursitis, compartment syndrome, DVT   Co morbidities that complicate the patient evaluation  anemia, HTN, HLD,   Additional history obtained:  Dr. Oley PCP   Problem List / ED Course / Critical interventions / Medication management  Patient presents to ED concern for left leg pain and swelling x 2 weeks.  Physical exam with 1+ pitting edema of left lower extremity.  There is also erythema.  I am more suspicious for cellulitis, however I would like to obtain ultrasound to ensure the patient is not having a blood clot.  Patient denies any respiratory complaint or recent chest pain.  Patient afebrile with stable vitals. I Ordered, and personally interpreted labs.  CBC with mild leukocytosis at 11.6.  No anemia.  BMP with hyperglycemia at 150. Shared results with patient.  Answered all questions.  There is not a vascular ultrasound tech here at [facility] at this time.  I will order patient outpatient ultrasound.  Will also start patient on antibiotics for his probable cellulitis.  Patient stating that his cellulitis resolved well with Keflex  in the past.  Patient to follow-up with PCP.   I have reviewed the patients home medicines and have made adjustments as needed The patient has been appropriately medically screened and/or stabilized in the ED. I have low suspicion for any other emergent medical condition which would require further screening, evaluation or treatment in the ED or require inpatient management. At time of discharge the patient is hemodynamically stable and in no acute distress. I have discussed work-up results and diagnosis with patient and answered all  questions. Patient is agreeable with discharge plan. We discussed strict return precautions for returning to the emergency department and they verbalized understanding.     Social Determinants of Health:  none      Final diagnoses:  Leg swelling    ED Discharge Orders          Ordered    cephALEXin  (KEFLEX ) 500 MG capsule  4 times daily        01/21/24 2010    VAS  US  LOWER EXTREMITY VENOUS (DVT)        01/21/24 2011               Hoy Nidia JULIANNA DEVONNA 01/21/24 2012    Francesca Elsie CROME, MD 01/21/24 [phone]

## 2024-01-22 ENCOUNTER — Ambulatory Visit (HOSPITAL_COMMUNITY)
Admission: RE | Admit: 2024-01-22 | Discharge: 2024-01-22 | Disposition: A | Discharge status: Home / Self Care | Source: Ambulatory Visit | Attending: Emergency Medicine | Admitting: Emergency Medicine

## 2024-01-22 DIAGNOSIS — M7989 Other specified soft tissue disorders: Secondary | ICD-10-CM | POA: Diagnosis not present

## 2024-01-22 DIAGNOSIS — M79662 Pain in left lower leg: Secondary | ICD-10-CM | POA: Diagnosis not present

## 2024-01-31 ENCOUNTER — Encounter: Payer: Self-pay | Admitting: Nurse Practitioner

## 2024-01-31 ENCOUNTER — Ambulatory Visit: Payer: Self-pay | Admitting: Nurse Practitioner

## 2024-01-31 VITALS — BP 129/70 | HR 57 | Temp 97.9°F | Wt 290.0 lb

## 2024-01-31 DIAGNOSIS — E119 Type 2 diabetes mellitus without complications: Secondary | ICD-10-CM

## 2024-01-31 DIAGNOSIS — L03116 Cellulitis of left lower limb: Secondary | ICD-10-CM

## 2024-01-31 NOTE — Patient Instructions (Signed)
 Cellulitis, Adult    Cellulitis is a skin infection. The infected area is often warm, red, swollen, and sore. It occurs most often on the legs, feet, and toes, but can happen on any part of the body.  This condition can be life-threatening without treatment. It is very important to get treated right away.  What are the causes?  This condition is caused by bacteria. The bacteria enter through a break in the skin, such as:  A cut.  A burn.  A bug bite.  An animal bite.  An open sore.  A crack.  What increases the risk?  Having a weak body's defense system (immune system).  Being older than 64 years old.  Having a blood sugar problem (diabetes).  Having a long-term liver disease (cirrhosis) or kidney disease.  Being very overweight (obese).  Having a skin problem, such as:  An itchy rash.  A rash caused by a fungus.  A rash with blisters.  Slow movement of blood in the veins (venous stasis).  Fluid buildup below the skin (edema).  This condition is more likely to occur in people who:  Have open cuts, burns, bites, or scrapes on the skin.  Have been treated with high-energy rays (radiation).  Use IV drugs.  What are the signs or symptoms?  Skin that:  Looks red or purple, or slightly darker than your usual skin color.  Has streaks.  Has spots.  Is swollen.  Is sore or painful when you touch it.  Is warm.  A fever.  Chills.  Blisters.  Tiredness (fatigue).  How is this treated?  Medicines to treat infections or allergies.  Rest.  Placing cold or warm cloths on the skin.  Staying in the hospital, if the condition is very bad. You may need medicines through an IV.  Follow these instructions at home:  Medicines  Take over-the-counter and prescription medicines only as told by your doctor.  If you were prescribed antibiotics, take them as told by your doctor. Do not stop using them even if you start to feel better.  General instructions  Drink enough fluid to keep your pee (urine) pale yellow.  Do not touch or rub the  infected area.  Raise (elevate) the infected area above the level of your heart while you are sitting or lying down.  Return to your normal activities when your doctor says that it is safe.  Place cold or warm cloths on the area as told by your doctor.  Keep all follow-up visits. Your doctor will need to make sure that a more serious infection is not developing.  Contact a doctor if:  You have a fever.  You do not start to get better after 1-2 days of treatment.  Your bone or joint under the infected area starts to hurt after the skin has healed.  Your infection comes back in the same area or another area. Signs of this may include:  You have a swollen bump in the area.  Your red area gets larger, turns dark in color, or hurts more.  You have more fluid coming from the wound.  Pus or a bad smell develops in your infected area.  You have more pain.  You feel sick and have muscle aches and weakness.  You develop vomiting or watery poop that will not go away.  Get help right away if:  You see red streaks coming from the area.  You notice the skin turns purple or black and falls  off.  These symptoms may be an emergency. Get help right away. Call 911.  Do not wait to see if the symptoms will go away.  Do not drive yourself to the hospital.  This information is not intended to replace advice given to you by your health care provider. Make sure you discuss any questions you have with your health care provider.  Document Revised: 02/15/2022 Document Reviewed: 02/15/2022  Elsevier Patient Education  2024 [employer].

## 2024-01-31 NOTE — Progress Notes (Signed)
 Subjective   Patient ID: Todd Osborn, male    DOB: February 15, 1961, 63 y.o.   MRN: [id number]  Chief Complaint  Patient presents with   Medical Management of Chronic Issues    Referring provider: Oley Bascom RAMAN, NP  Todd Osborn is a 63 y.o. male with Past Medical History: No date: Anemia No date: Anxiety No date: Blood transfusion without reported diagnosis 03/16/2017: Colon abnormality Positive COLOGUARD **POSITIVE** No date: History of cellulitis No date: Hypertension  HPI  Patient presents today for ED follow-up.  He was seen in the ED on 01/21/2024 for leg swelling.  He was diagnosed with cellulitis and was given cephalexin  p.o. still taking antibiotics. Leg is much improved. Leg is still slightly erythematous to shin. Denies f/c/s, n/v/d, hemoptysis, PND, leg swelling Denies chest pain or edema    Allergies  Allergen Reactions   Penicillins Hives    Has patient had a PCN reaction causing immediate rash, facial/tongue/throat swelling, SOB or lightheadedness with hypotension: No Has patient had a PCN reaction causing severe rash involving mucus membranes or skin necrosis: No Has patient had a PCN reaction that required hospitalization: No Has patient had a PCN reaction occurring within the last 10 years: No If all of the above answers are NO, then may proceed with Cephalosporin use. Pt tolerated Keflex  in April 2018   Amlodipine      Nosebleed    Losartan      Nosebleed     Immunization History  Administered Date(s) Administered   Tdap 10/05/2012    Tobacco History: Social History   Tobacco Use  Smoking Status Former   Current packs/day: 0.00   Types: Cigarettes   Quit date: 07/04/1986   Years since quitting: 37.6  Smokeless Tobacco Never   Counseling given: Not Answered   Outpatient Encounter Medications as of 01/31/2024  Medication Sig   acetaminophen  (TYLENOL ) 500 MG tablet Take 1 tablet (500 mg total) by mouth every 6 (six) hours as needed.    hydrochlorothiazide  (HYDRODIURIL ) 25 MG tablet Take 1 tablet (25 mg total) by mouth daily.   methylPREDNISolone  (MEDROL  DOSEPAK) 4 MG TBPK tablet Take as instructed on the packaging   pantoprazole  (PROTONIX ) 40 MG tablet Take 1 tablet (40 mg total) by mouth daily.   rosuvastatin  (CRESTOR ) 20 MG tablet Take 1 tablet (20 mg total) by mouth daily.   Multiple Vitamins-Iron  (MULTIVITAMIN/IRON  PO) Take 1 capsule by mouth daily. (Patient not taking: Reported on 01/17/2024)   No facility-administered encounter medications on file as of 01/31/2024.    Review of Systems  Review of Systems  Constitutional: Negative.   HENT: Negative.    Cardiovascular: Negative.   Gastrointestinal: Negative.   Allergic/Immunologic: Negative.   Neurological: Negative.   Psychiatric/Behavioral: Negative.       Objective:   BP 129/70   Pulse (!) 57   Temp 97.9 F (36.6 C) (Oral)   Wt 290 lb (131.5 kg)   SpO2 94%   BMI 40.45 kg/m   Wt Readings from Last 5 Encounters:  01/31/24 290 lb (131.5 kg)  01/21/24 290 lb (131.5 kg)  01/17/24 291 lb (132 kg)  10/26/23 293 lb (132.9 kg)  08/07/23 291 lb (132 kg)     Physical Exam Vitals and nursing note reviewed.  Constitutional:      General: He is not in acute distress.    Appearance: He is well-developed.  Cardiovascular:     Rate and Rhythm: Normal rate and regular rhythm.  Pulmonary:  Effort: Pulmonary effort is normal.     Breath sounds: Normal breath sounds.  Skin:    General: Skin is warm and dry.  Neurological:     Mental Status: He is alert and oriented to person, place, and time.       Assessment & Plan:   Cellulitis of left lower extremity  Type 2 diabetes mellitus without complication, without long-term current use of insulin (HCC) -     Microalbumin / creatinine urine ratio    Complete entire course of antibiotics  Return in about 3 months (around 05/02/2024).   Bascom GORMAN Borer, NP 01/31/2024

## 2024-02-01 LAB — MICROALBUMIN / CREATININE URINE RATIO
Creatinine, Urine: 124.1 mg/dL
Microalb/Creat Ratio: 36 mg/g{creat} — ABNORMAL HIGH (ref 0–29)
Microalbumin, Urine: 45.1 ug/mL

## 2024-05-02 ENCOUNTER — Ambulatory Visit: Payer: Self-pay | Admitting: Nurse Practitioner

## 2024-05-22 ENCOUNTER — Ambulatory Visit: Payer: Self-pay | Admitting: Nurse Practitioner

## 2024-05-22 ENCOUNTER — Encounter: Payer: Self-pay | Admitting: Nurse Practitioner

## 2024-05-22 VITALS — BP 134/69 | HR 81 | Wt 294.8 lb

## 2024-05-22 DIAGNOSIS — E119 Type 2 diabetes mellitus without complications: Secondary | ICD-10-CM | POA: Diagnosis not present

## 2024-05-22 LAB — POCT GLYCOSYLATED HEMOGLOBIN (HGB A1C): Hemoglobin A1C: 6.3 % — AB (ref 4.0–5.6)

## 2024-05-22 NOTE — Progress Notes (Signed)
 Subjective   Patient ID: Todd Osborn, male    DOB: [birth year], 63 y.o.   MRN: [id number]  Chief Complaint  Patient presents with   Diabetes    Referring provider: Oley Bascom RAMAN, NP  Arley LITTIE Pollack is a 63 y.o. male with Past Medical History: No date: Anemia No date: Anxiety No date: Blood transfusion without reported diagnosis 03/16/2017: Colon abnormality Positive COLOGUARD **POSITIVE** No date: History of cellulitis No date: Hypertension  HPI  Patient presents for a diabetes follow up today. Overall doing well. Trying to do better with diet. No new issues or concerns. A1C today is 6.3. Complaint with medications, diet, and exercise. Denies f/c/s, n/v/d, hemoptysis, PND, leg swelling. Denies chest pain or edema.      Allergies  Allergen Reactions   Penicillins Hives    Has patient had a PCN reaction causing immediate rash, facial/tongue/throat swelling, SOB or lightheadedness with hypotension: No Has patient had a PCN reaction causing severe rash involving mucus membranes or skin necrosis: No Has patient had a PCN reaction that required hospitalization: No Has patient had a PCN reaction occurring within the last 10 years: No If all of the above answers are NO, then may proceed with Cephalosporin use. Pt tolerated Keflex  in April 2018   Amlodipine      Nosebleed    Losartan      Nosebleed     Immunization History  Administered Date(s) Administered   Tdap 10/05/2012    Tobacco History: Social History   Tobacco Use  Smoking Status Former   Current packs/day: 0.00   Types: Cigarettes   Quit date: 07/04/1986   Years since quitting: 37.9  Smokeless Tobacco Never   Counseling given: Not Answered   Outpatient Encounter Medications as of 05/22/2024  Medication Sig   acetaminophen  (TYLENOL ) 500 MG tablet Take 1 tablet (500 mg total) by mouth every 6 (six) hours as needed.   hydrochlorothiazide  (HYDRODIURIL ) 25 MG tablet Take 1 tablet (25 mg total) by mouth  daily.   rosuvastatin  (CRESTOR ) 20 MG tablet Take 1 tablet (20 mg total) by mouth daily.   methylPREDNISolone  (MEDROL  DOSEPAK) 4 MG TBPK tablet Take as instructed on the packaging (Patient not taking: Reported on 05/22/2024)   Multiple Vitamins-Iron  (MULTIVITAMIN/IRON  PO) Take 1 capsule by mouth daily. (Patient not taking: Reported on 05/22/2024)   pantoprazole  (PROTONIX ) 40 MG tablet Take 1 tablet (40 mg total) by mouth daily. (Patient not taking: Reported on 05/22/2024)   No facility-administered encounter medications on file as of 05/22/2024.    Review of Systems  Review of Systems  Constitutional: Negative.   HENT: Negative.    Cardiovascular: Negative.   Gastrointestinal: Negative.   Allergic/Immunologic: Negative.   Neurological: Negative.   Psychiatric/Behavioral: Negative.       Objective:   BP 134/69 (BP Location: Left Arm, Patient Position: Sitting, Cuff Size: Large)   Pulse 81   Wt 294 lb 12.8 oz (133.7 kg)   SpO2 94%   BMI 41.12 kg/m   Wt Readings from Last 5 Encounters:  05/22/24 294 lb 12.8 oz (133.7 kg)  01/31/24 290 lb (131.5 kg)  01/21/24 290 lb (131.5 kg)  01/17/24 291 lb (132 kg)  10/26/23 293 lb (132.9 kg)     Physical Exam Vitals and nursing note reviewed.  Constitutional:      General: He is not in acute distress.    Appearance: He is well-developed.  Cardiovascular:     Rate and Rhythm: Normal rate and regular rhythm.  Pulmonary:     Effort: Pulmonary effort is normal.     Breath sounds: Normal breath sounds.  Skin:    General: Skin is warm and dry.  Neurological:     Mental Status: He is alert and oriented to person, place, and time.       Assessment & Plan:   Type 2 diabetes mellitus without complication, without long-term current use of insulin (HCC) -     POCT glycosylated hemoglobin (Hb A1C)     Return in about 3 months (around 08/22/2024).     Bascom GORMAN Borer, NP 05/22/2024

## 2024-07-20 ENCOUNTER — Other Ambulatory Visit: Payer: Self-pay | Admitting: Cardiology

## 2024-11-20 ENCOUNTER — Ambulatory Visit: Admitting: Nurse Practitioner

## 2024-11-20 ENCOUNTER — Ambulatory Visit: Payer: Self-pay | Admitting: Nurse Practitioner
